# Patient Record
Sex: Female | Born: 1985 | Race: White | Hispanic: No | Smoking: Never smoker
Health system: Southern US, Community
[De-identification: ages and names within clinical notes are randomized; demographics above are authoritative.]

## PROBLEM LIST (undated history)

## (undated) DIAGNOSIS — K589 Irritable bowel syndrome without diarrhea: Secondary | ICD-10-CM

## (undated) DIAGNOSIS — F32A Depression, unspecified: Secondary | ICD-10-CM

## (undated) DIAGNOSIS — F419 Anxiety disorder, unspecified: Secondary | ICD-10-CM

## (undated) DIAGNOSIS — F5101 Primary insomnia: Secondary | ICD-10-CM

## (undated) DIAGNOSIS — D649 Anemia, unspecified: Secondary | ICD-10-CM

## (undated) DIAGNOSIS — R5382 Chronic fatigue, unspecified: Secondary | ICD-10-CM

## (undated) DIAGNOSIS — J069 Acute upper respiratory infection, unspecified: Secondary | ICD-10-CM

## (undated) DIAGNOSIS — F329 Major depressive disorder, single episode, unspecified: Secondary | ICD-10-CM

## (undated) HISTORY — DX: Acute upper respiratory infection, unspecified: J06.9

## (undated) HISTORY — DX: Major depressive disorder, single episode, unspecified: F32.9

## (undated) HISTORY — DX: Depression, unspecified: F32.A

## (undated) HISTORY — DX: Irritable bowel syndrome, unspecified: K58.9

## (undated) HISTORY — DX: Anemia, unspecified: D64.9

## (undated) HISTORY — DX: Anxiety disorder, unspecified: F41.9

## (undated) HISTORY — DX: Primary insomnia: F51.01

## (undated) HISTORY — PX: TUBAL LIGATION: SHX77

## (undated) HISTORY — DX: Chronic fatigue, unspecified: R53.82

---

## 2016-02-10 ENCOUNTER — Ambulatory Visit (HOSPITAL_COMMUNITY)
Admission: EM | Admit: 2016-02-10 | Discharge: 2016-02-10 | Disposition: A | Discharge status: Home / Self Care | Payer: Medicaid Other | Attending: Family Medicine | Admitting: Family Medicine

## 2016-02-10 ENCOUNTER — Encounter (HOSPITAL_COMMUNITY): Payer: Self-pay | Admitting: Emergency Medicine

## 2016-02-10 DIAGNOSIS — L237 Allergic contact dermatitis due to plants, except food: Secondary | ICD-10-CM

## 2016-02-10 MED ORDER — METHYLPREDNISOLONE SODIUM SUCC 125 MG IJ SOLR
INTRAMUSCULAR | Status: AC
  Filled 2016-02-10: qty 2

## 2016-02-10 MED ORDER — PREDNISONE 10 MG (21) PO TBPK: 10.0000 mg | ORAL_TABLET | Freq: Every day | ORAL | 0 refills | Status: DC

## 2016-02-10 MED ORDER — METHYLPREDNISOLONE SODIUM SUCC 125 MG IJ SOLR
125.0000 mg | Freq: Once | INTRAMUSCULAR | Status: AC
  Administered 2016-02-10: 125 mg via INTRAVENOUS

## 2016-02-10 NOTE — ED Triage Notes (Signed)
Pt here for possible poison ivy rash onset 2 days all over body and now in the genital area  States she was working in the yard   Denies fevers, chills  Taking antibiotic for UTI

## 2016-02-10 NOTE — ED Provider Notes (Signed)
CSN: [id number]     Arrival date & time 02/10/16  1443 History   First MD Initiated Contact with Patient 02/10/16 [phone]     Chief Complaint  Patient presents with  . Poison Ivy   (Consider location/radiation/quality/duration/timing/severity/associated sxs/prior Treatment) 30 y.o. female presents with rash to right arm and genital  X 2 days. Patient states she gardening in her yard without wearing gloves. Patient has red streaks with blisters and reports itching Condition is acute in nature. Condition is made better by nothing. Condition is made worse by nothing. Patient denies any relief from benadryl prior to there arrival at this facility.        History reviewed. No pertinent past medical history. History reviewed. No pertinent surgical history. History reviewed. No pertinent family history. Social History  Substance Use Topics  . Smoking status: Never Smoker  . Smokeless tobacco: Never Used  . Alcohol use No   OB History    No data available     Review of Systems  Allergies  Review of patient's allergies indicates no known allergies.  Home Medications   Prior to Admission medications   Not on File   Meds Ordered and Administered this Visit   Medications  methylPREDNISolone sodium succinate (SOLU-MEDROL) 125 mg/2 mL injection 125 mg (not administered)    BP 118/66 (BP Location: Right Arm)   Pulse 71   Temp 97 F (36.1 C) (Oral)   Resp 20   LMP 01/12/2016   SpO2 100%  No data found.   Physical Exam  Urgent Care Course   Clinical Course    Procedures (including critical care time)  Labs Review Labs Reviewed - No data to display  Imaging Review No results found.   Visual Acuity Review  Right Eye Distance:   Left Eye Distance:   Bilateral Distance:    Right Eye Near:   Left Eye Near:    Bilateral Near:         MDM   1. Poison ivy dermatitis        Alene MiresJennifer C Lidiya Reise, NP 02/10/16 [phone]

## 2016-02-10 NOTE — Discharge Instructions (Signed)
Take benadryl as instructed on the back of the box. Can take pepcid for histamine inhibitor .

## 2017-08-28 ENCOUNTER — Other Ambulatory Visit: Payer: Self-pay | Admitting: Orthopedic Surgery

## 2017-08-28 ENCOUNTER — Other Ambulatory Visit (HOSPITAL_COMMUNITY): Payer: Self-pay | Admitting: Orthopedic Surgery

## 2017-08-28 DIAGNOSIS — M25571 Pain in right ankle and joints of right foot: Principal | ICD-10-CM

## 2017-08-28 DIAGNOSIS — G8929 Other chronic pain: Secondary | ICD-10-CM

## 2017-09-01 ENCOUNTER — Other Ambulatory Visit: Payer: Self-pay | Admitting: Orthopedic Surgery

## 2017-09-01 DIAGNOSIS — M25511 Pain in right shoulder: Secondary | ICD-10-CM

## 2017-09-08 ENCOUNTER — Other Ambulatory Visit: Payer: Self-pay

## 2017-09-08 ENCOUNTER — Ambulatory Visit: Payer: Self-pay

## 2017-09-10 ENCOUNTER — Ambulatory Visit
Admission: RE | Admit: 2017-09-10 | Discharge: 2017-09-10 | Disposition: A | Discharge status: Home / Self Care | Payer: Medicaid Other | Source: Ambulatory Visit | Attending: Orthopedic Surgery | Admitting: Orthopedic Surgery

## 2017-09-10 DIAGNOSIS — M7591 Shoulder lesion, unspecified, right shoulder: Secondary | ICD-10-CM | POA: Diagnosis not present

## 2017-09-10 DIAGNOSIS — M25511 Pain in right shoulder: Secondary | ICD-10-CM

## 2017-09-10 DIAGNOSIS — M25811 Other specified joint disorders, right shoulder: Secondary | ICD-10-CM | POA: Diagnosis not present

## 2017-09-10 MED ORDER — GADOBENATE DIMEGLUMINE 529 MG/ML IV SOLN
5.0000 mL | Freq: Once | INTRAVENOUS | Status: AC | PRN
  Administered 2017-09-10: 0.1 mL via INTRA_ARTICULAR

## 2017-09-10 MED ORDER — LIDOCAINE HCL (PF) 1 % IJ SOLN
5.0000 mL | Freq: Once | INTRAMUSCULAR | Status: AC
  Administered 2017-09-10: 5 mL
  Filled 2017-09-10: qty 5

## 2017-09-10 MED ORDER — SODIUM CHLORIDE 0.9 % IJ SOLN
10.0000 mL | INTRAMUSCULAR | Status: DC | PRN
  Administered 2017-09-10: 10 mL
  Filled 2017-09-10: qty 10

## 2017-09-10 MED ORDER — IOPAMIDOL (ISOVUE-200) INJECTION 41%
50.0000 mL | Freq: Once | INTRAVENOUS | Status: AC | PRN
  Administered 2017-09-10: 10 mL
  Filled 2017-09-10: qty 50

## 2017-10-18 ENCOUNTER — Emergency Department (HOSPITAL_COMMUNITY)
Admission: EM | Admit: 2017-10-18 | Discharge: 2017-10-18 | Disposition: A | Discharge status: Home / Self Care | Payer: Medicaid Other | Attending: Emergency Medicine | Admitting: Emergency Medicine

## 2017-10-18 ENCOUNTER — Other Ambulatory Visit: Payer: Self-pay

## 2017-10-18 ENCOUNTER — Encounter (HOSPITAL_COMMUNITY): Payer: Self-pay

## 2017-10-18 DIAGNOSIS — Z79899 Other long term (current) drug therapy: Secondary | ICD-10-CM | POA: Diagnosis not present

## 2017-10-18 DIAGNOSIS — T50901A Poisoning by unspecified drugs, medicaments and biological substances, accidental (unintentional), initial encounter: Secondary | ICD-10-CM | POA: Diagnosis present

## 2017-10-18 LAB — CBC WITH DIFFERENTIAL/PLATELET
ABS IMMATURE GRANULOCYTES: 0 10*3/uL (ref 0.0–0.1)
BASOS ABS: 0 10*3/uL (ref 0.0–0.1)
BASOS PCT: 1 %
EOS ABS: 0.1 10*3/uL (ref 0.0–0.7)
EOS PCT: 2 %
HCT: 41.2 % (ref 36.0–46.0)
Hemoglobin: 13.9 g/dL (ref 12.0–15.0)
Immature Granulocytes: 0 %
Lymphocytes Relative: 42 %
Lymphs Abs: 2.2 10*3/uL (ref 0.7–4.0)
MCH: 30 pg (ref 26.0–34.0)
MCHC: 33.7 g/dL (ref 30.0–36.0)
MCV: 88.8 fL (ref 78.0–100.0)
MONO ABS: 0.7 10*3/uL (ref 0.1–1.0)
Monocytes Relative: 13 %
Neutro Abs: 2.2 10*3/uL (ref 1.7–7.7)
Neutrophils Relative %: 42 %
PLATELETS: 274 10*3/uL (ref 150–400)
RBC: 4.64 MIL/uL (ref 3.87–5.11)
RDW: 12.1 % (ref 11.5–15.5)
WBC: 5.1 10*3/uL (ref 4.0–10.5)

## 2017-10-18 LAB — I-STAT CHEM 8, ED
BUN: 17 mg/dL (ref 6–20)
CALCIUM ION: 1.16 mmol/L (ref 1.15–1.40)
CHLORIDE: 103 mmol/L (ref 101–111)
Creatinine, Ser: 0.9 mg/dL (ref 0.44–1.00)
GLUCOSE: 84 mg/dL (ref 65–99)
HCT: 40 % (ref 36.0–46.0)
Hemoglobin: 13.6 g/dL (ref 12.0–15.0)
Potassium: 3.6 mmol/L (ref 3.5–5.1)
Sodium: 140 mmol/L (ref 135–145)
TCO2: 23 mmol/L (ref 22–32)

## 2017-10-18 LAB — I-STAT BETA HCG BLOOD, ED (MC, WL, AP ONLY)

## 2017-10-18 MED ORDER — SODIUM CHLORIDE 0.9 % IV BOLUS
1000.0000 mL | Freq: Once | INTRAVENOUS | Status: AC
Start: 2017-10-18 — End: 2017-10-18
  Administered 2017-10-18: 1000 mL via INTRAVENOUS

## 2017-10-18 NOTE — ED Provider Notes (Signed)
[facility] EMERGENCY DEPARTMENT Provider Note   CSN: [id number] Arrival date & time: 10/18/17  0118     History   Chief Complaint Chief Complaint  Patient presents with  . Drug Overdose    HPI Ashlee Ellis is a 32 y.o. female.  The history is provided by the patient.  Ingestion  This is a new problem. The current episode started more than 2 days ago. The problem occurs constantly. The problem has not changed since onset.Pertinent negatives include no chest pain, no abdominal pain, no headaches and no shortness of breath. Nothing aggravates the symptoms. Nothing relieves the symptoms. She has tried nothing for the symptoms. The treatment provided no relief.  Accidentally switched her Flagyl which is supposed to be TID and cipro which is supposed to be BID for bacterial overgrowth in the gut.  No muscle weakness.  No tendon issues.  No SI or HI  History reviewed. No pertinent past medical history.  There are no active problems to display for this patient.   History reviewed. No pertinent surgical history.   OB History   None      Home Medications    Prior to Admission medications   Medication Sig Start Date End Date Taking? Authorizing Provider  predniSONE (STERAPRED UNI-PAK 21 TAB) 10 MG (21) TBPK tablet Take 1 tablet (10 mg total) by mouth daily. Take 6 tabs by mouth daily  for 2 days, then 5 tabs for 2 days, then 4 tabs for 2 days, then 3 tabs for 2 days, 2 tabs for 2 days, then 1 tab by mouth daily for 2 days 02/10/16   Alene Mires, NP    Family History No family history on file.  Social History Social History   Tobacco Use  . Smoking status: Never Smoker  . Smokeless tobacco: Never Used  Substance Use Topics  . Alcohol use: No  . Drug use: Not on file     Allergies   Amoxicillin   Review of Systems Review of Systems  Respiratory: Negative for shortness of breath.   Cardiovascular: Negative for chest pain.    Gastrointestinal: Negative for abdominal pain.  Genitourinary: Negative for flank pain.  Musculoskeletal: Negative for arthralgias, back pain, gait problem, joint swelling and neck pain.  Neurological: Negative for headaches.  Psychiatric/Behavioral: Negative for hallucinations, self-injury, sleep disturbance and suicidal ideas.  All other systems reviewed and are negative.    Physical Exam Updated Vital Signs BP 106/70   Pulse 62   Temp 98.4 F (36.9 C) (Oral)   Resp 15   Ht  (1.575 m)   Wt 56.7 kg (125 lb)   SpO2 99%   BMI 22.86 kg/m   Physical Exam  Constitutional: She is oriented to person, place, and time. She appears well-developed and well-nourished. No distress.  HENT:  Head: Normocephalic and atraumatic.  Right Ear: External ear normal.  Left Ear: External ear normal.  Nose: Nose normal.  Mouth/Throat: Oropharynx is clear and moist. No oropharyngeal exudate.  Eyes: Pupils are equal, round, and reactive to light. Conjunctivae and EOM are normal.  Neck: Normal range of motion. Neck supple.  Cardiovascular: Normal rate, regular rhythm, normal heart sounds and intact distal pulses.  Pulmonary/Chest: Effort normal and breath sounds normal. No stridor. She has no wheezes. She has no rales.  Abdominal: Soft. Bowel sounds are normal. She exhibits no mass. There is no tenderness. There is no rebound and no guarding.  Musculoskeletal: Normal range of motion.  Neurological: She is alert and oriented to person, place, and time. She displays normal reflexes.  Skin: Skin is warm and dry. Capillary refill takes less than 2 seconds.  Psychiatric: She has a normal mood and affect. She expresses no homicidal and no suicidal ideation. She expresses no suicidal plans and no homicidal plans.     ED Treatments / Results  Labs (all labs ordered are listed, but only abnormal results are displayed) Results for orders placed or performed during the hospital encounter of 10/18/17   CBC with Differential/Platelet  Result Value Ref Range   WBC 5.1 4.0 - 10.5 K/uL   RBC 4.64 3.87 - 5.11 MIL/uL   Hemoglobin 13.9 12.0 - 15.0 g/dL   HCT 40.9 81.1 - 91.4 %   MCV 88.8 78.0 - 100.0 fL   MCH 30.0 26.0 - 34.0 pg   MCHC 33.7 30.0 - 36.0 g/dL   RDW 78.2 95.6 - 21.3 %   Platelets 274 150 - 400 K/uL   Neutrophils Relative % 42 %   Neutro Abs 2.2 1.7 - 7.7 K/uL   Lymphocytes Relative 42 %   Lymphs Abs 2.2 0.7 - 4.0 K/uL   Monocytes Relative 13 %   Monocytes Absolute 0.7 0.1 - 1.0 K/uL   Eosinophils Relative 2 %   Eosinophils Absolute 0.1 0.0 - 0.7 K/uL   Basophils Relative 1 %   Basophils Absolute 0.0 0.0 - 0.1 K/uL   Immature Granulocytes 0 %   Abs Immature Granulocytes 0.0 0.0 - 0.1 K/uL  I-Stat Chem 8, ED  Result Value Ref Range   Sodium 140 135 - 145 mmol/L   Potassium 3.6 3.5 - 5.1 mmol/L   Chloride 103 101 - 111 mmol/L   BUN 17 6 - 20 mg/dL   Creatinine, Ser 0.86 0.44 - 1.00 mg/dL   Glucose, Bld 84 65 - 99 mg/dL   Calcium, Ion 5.78 4.69 - 1.40 mmol/L   TCO2 23 22 - 32 mmol/L   Hemoglobin 13.6 12.0 - 15.0 g/dL   HCT 62.9 52.8 - 41.3 %  I-Stat Beta hCG blood, ED ([facility], [facility], [facility] only)  Result Value Ref Range   I-stat hCG, quantitative <5.0 <5 mIU/mL   Comment 3           No results found.  EKG EKG Interpretation  Date/Time:  Saturday Oct 18 2017 01:44:04 EDT Ventricular Rate:  76 PR Interval:  120 QRS Duration: 74 QT Interval:  384 QTC Calculation: 432 R Axis:   68 Text Interpretation:  Normal sinus rhythm with sinus arrhythmia Confirmed by Nicanor Alcon, Lundynn Cohoon ([id number]) on 10/18/2017 2:52:58 AM   Radiology No results found.  Procedures Procedures (including critical care time)  Medications Ordered in ED Medications  sodium chloride 0.9 % bolus 1,000 mL (1,000 mLs Intravenous New Bag/Given 10/18/17 0248)     Per poison control check creatinine if normal stable for discharge  Final Clinical Impressions(s) / ED Diagnoses  Counseled on appropriate dose  of medications.  Patient verbalizes understanding.    Return for weakness, numbness, changes in vision or speech, fevers >100.4 unrelieved by medication, shortness of breath, intractable vomiting, or diarrhea, abdominal pain, Inability to tolerate liquids or food, cough, altered mental status or any concerns. No signs of systemic illness or infection. The patient is nontoxic-appearing on exam and vital signs are within normal limits.   I have reviewed the triage vital signs and the nursing notes. Pertinent labs &imaging results that were available during my care of the  patient were reviewed by me and considered in my medical decision making (see chart for details).  After history, exam, and medical workup I feel the patient has been appropriately medically screened and is safe for discharge home. Pertinent diagnoses were discussed with the patient. Patient was given return precautions.         Alica Shellhammer, MD 10/18/17 [phone]

## 2017-10-18 NOTE — ED Triage Notes (Signed)
Pt states that she accidentally switched her two antibiotics Flagyl and Cipro. Flagyl was supposed to be 3 times and she only took twice a day, and Cipro was supposed to be taken twice daily and taken three times for the past four days, she just noticed today. Denies SI/HI. C/o of itchiness all over, restlessness, metallic taste in mouth, irritability, and R shoulder pain, pt list multiple other complaints.

## 2017-10-18 NOTE — ED Notes (Signed)
Poison Control notified; recommend checking renal function.

## 2017-10-22 ENCOUNTER — Ambulatory Visit: Payer: Medicaid Other | Attending: Internal Medicine | Admitting: Physical Therapy

## 2017-10-22 ENCOUNTER — Encounter: Payer: Self-pay | Admitting: Physical Therapy

## 2017-10-22 ENCOUNTER — Other Ambulatory Visit: Payer: Self-pay

## 2017-10-22 DIAGNOSIS — R252 Cramp and spasm: Secondary | ICD-10-CM | POA: Diagnosis present

## 2017-10-22 DIAGNOSIS — R278 Other lack of coordination: Secondary | ICD-10-CM

## 2017-10-22 DIAGNOSIS — M6281 Muscle weakness (generalized): Secondary | ICD-10-CM

## 2017-10-22 NOTE — Therapy (Addendum)
[facility] [address], [place], [place], [address] Phone: [phone]   Fax:  [phone]  Physical Therapy Evaluation  Patient Details  Name: Ashlee Ellis MRN: [id number] Date of Birth: 1986-03-06 Referring Provider: Auburn Bilberry, MD   Encounter Date: 10/22/2017  PT End of Session - 10/22/17 1233    Visit Number  1    Date for PT Re-Evaluation  12/31/17    PT Start Time  7412    PT Stop Time  1221    PT Time Calculation (min)  36 min    Activity Tolerance  Patient tolerated treatment well    Behavior During Therapy  [facility] for tasks assessed/performed       History reviewed. No pertinent past medical history.  History reviewed. No pertinent surgical history.  There were no vitals filed for this visit.   Subjective Assessment - 10/22/17 1147    Subjective  Has weakening and rectocele.  Has been constipated since was young.  Had c-section in 2006 and then pain became severe.  Had severe constipation with second pregnancy that became worse.  Pt has to strain to have BM.  I have pain when constipatied and full sharp pain.      Patient Stated Goals  be able to have BM more easily and avoid surgery    Currently in Pain?  No/denies         [facility] PT Assessment - 10/22/17 0001      Assessment   Medical Diagnosis  M62.89 (ICD-10-CM) - Pelvic floor dysfunction    Referring Provider  Auburn Bilberry, MD    Onset Date/Surgical Date  -- 2014    Prior Therapy  No      Precautions   Precautions  None      Restrictions   Weight Bearing Restrictions  No      Balance Screen   Has the patient fallen in the past 6 months  Yes    How many times?  1 tripped on a [place] residence    Living Arrangements  Spouse/significant other;Children 5      Prior Function   Level of Independence  Independent      Cognition   Overall Cognitive Status  Within Functional  Limits for tasks assessed      ROM / Strength   AROM / PROM / Strength  Strength;PROM      PROM   Overall PROM Comments  right hip ER, IR, flexion 15% limited compared to left hip      Strength   Overall Strength Comments  bilateral hip addcution 4/5; bilateral hip abduciton and flexion 4+/5      Palpation   Palpation comment  hamstrings tight bilaterally Rt>Lt      Special Tests   Other special tests  -- hip tests all negative, lumbar assessed WNL      Ambulation/Gait   Gait Pattern  Within Functional Limits                Objective measurements completed on examination: See above findings.    Pelvic Floor Special Questions - 10/22/17 0001    Prior Pelvic/Prostate Exam  Yes    Date of Last Pelvic/Prostate Exam  -- 1 month ago    Diagnostic Test/Procedure Performed  -- weakness     Are you Pregnant or attempting pregnancy?  No    Currently Sexually Active  Yes    Is this Painful  No    Urinary Leakage  Yes    Activities that cause leaking  Sneezing jumping    Urinary urgency  No    Urinary frequency  nocturia 2-3    Fluid intake  3 liters    Caffeine beverages  No    Falling out feeling (prolapse)  No    Skin Integrity  Intact    Pelvic Floor Internal Exam  pt informed and consent given to perform internal soft tissue assessment    Exam Type  Rectal    Sensation  normal    Palpation  tight and tender anal sphincters and puborectalis, unable to palpate to coccyx due to muscle spasms, unable to relax puborectalis after contracting, unable to coordinate for pushing finger out of rectum    Strength  good squeeze, good lift, able to hold agaisnt strong resistance    Strength # of reps  2 unable to relax after 2 contractions    Strength # of seconds  5    Tone  high               PT Education - 10/22/17 1232    Education provided  Yes    Education Details  POC, toileting    Person(s) Educated  Patient    Methods  Explanation;Handout    Comprehension   Verbalized understanding       PT Short Term Goals - 10/22/17 1418      PT SHORT TERM GOAL #1   Title  pt will be ind with initial HEP and toilet techniques    Time  4    Period  Weeks    Status  New    Target Date  11/19/17        PT Long Term Goals - 10/22/17 1419      PT LONG TERM GOAL #1   Title  pt will be able to have BM of about 4x the volume of current bowel movement due to improved muscle coordination    Baseline  one golf ball size at a time    Time  10    Period  Weeks    Status  New    Target Date  12/31/17      PT LONG TERM GOAL #2   Title  pt will be able to fully relax and bulge anal sphincter muscle    Baseline  unable to lengthen muscles    Time  10    Period  Weeks    Status  New    Target Date  12/31/17      PT LONG TERM GOAL #3   Title  pt will be able to engage transverse abdominis muscle while relaxing pelvic floor in order to have bowel movement without straining or using valsalva maneuver.    Time  10    Period  Weeks    Status  New    Target Date  12/31/17      PT LONG TERM GOAL #4   Title  Pt will demonstrate bilateral hip abduction and adduction strength of 5/5 MMT for improved pelvic stability with less stress on pelvic floor muscles to compensate    Baseline  4/5 to 4+/5    Time  10    Period  Weeks    Status  New    Target Date  12/31/17             Plan - 10/22/17 1400  Clinical Impression Statement  Pt presents to skilled PT due to chronic constipation and rectocele.  Pt is having difficulty with muscle coordination to have bowel movement without excessive bearing down.  Pt has bilateral LE weakness and decreased hip PROM.  Pt has tight hamstrings bilaterally.  She has tight and tender internal and external anal sphincters and puborectalis muscles.  Pt was unable to push finger out when bearing down.  Pt will benefit from skilled PT to address impairments in order to improve abilty to have BM with reduced risk of increasing  rectocele symptoms.     History and Personal Factors relevant to plan of care:  multiple    Clinical Presentation  Stable    Clinical Presentation due to:  pt has been stable since birth of twins    Clinical Decision Making  Moderate    Rehab Potential  Good    PT Frequency  1x / week    PT Duration  Other (comment) 10 weeks    PT Treatment/Interventions  ADLs/Self Care Home Management;Biofeedback;Cryotherapy;Electrical Stimulation;Moist Heat;Therapeutic activities;Therapeutic exercise;Neuromuscular re-education;Patient/family education;Dry needling;Taping;Passive range of motion;Manual techniques    PT Next Visit Plan  biofeedback, review toilet techniques, breath and bulge, stretches    Consulted and Agree with Plan of Care  Patient       Patient will benefit from skilled therapeutic intervention in order to improve the following deficits and impairments:  Increased muscle spasms, Impaired tone, Decreased coordination, Decreased strength, Increased fascial restricitons, Pain  Visit Diagnosis: Cramp and spasm  Muscle weakness (generalized)  Other lack of coordination     Problem List There are no active problems to display for this patient.   Zannie Cove, PT 10/22/2017, 3:19 PM  [facility] [address] [place], [place], [address] Phone: [phone]   Fax:  [phone]  Name: Ashlee Ellis MRN: [id number] Date of Birth: 05-Oct-1985   PHYSICAL THERAPY DISCHARGE SUMMARY  Visits from Start of Care: 1  Current functional level related to goals / functional outcomes: See above   Remaining deficits: See above   Education / Equipment: HEP  Plan: Patient agrees to discharge.  Patient goals were not met. Patient is being discharged due to not returning since the last visit.  ?????     Only attended evaluation  Zannie Cove, PT 11/21/17 11:43 AM

## 2017-10-22 NOTE — Patient Instructions (Signed)
Toileting Techniques for Bowel Movements (Defecation) Using your belly (abdomen) and pelvic floor muscles to have a bowel movement is usually instinctive.  Sometimes people can have problems with these muscles and have to relearn proper defecation (emptying) techniques.  If you have weakness in your muscles, organs that are falling out, decreased sensation in your pelvis, or ignore your urge to go, you may find yourself straining to have a bowel movement.  You are straining if you are: . holding your breath or taking in a huge gulp of air and holding it  . keeping your lips and jaw tensed and closed tightly . turning red in the face because of excessive pushing or forcing . developing or worsening your  hemorrhoids . getting faint while pushing . not emptying completely and have to defecate many times a day  If you are straining, you are actually making it harder for yourself to have a bowel movement.  Many people find they are pulling up with the pelvic floor muscles and closing off instead of opening the anus. Due to lack pelvic floor relaxation and coordination the abdominal muscles, one has to work harder to push the feces out.  Many people have never been taught how to defecate efficiently and effectively.  Notice what happens to your body when you are having a bowel movement.  While you are sitting on the toilet pay attention to the following areas: . Jaw and mouth position . Angle of your hips   . Whether your feet touch the ground or not . Arm placement  . Spine position . Waist . Belly tension . Anus (opening of the anal canal)  An Evacuation/Defecation Plan   Here are the 4 basic points:  1. Lean forward enough for your elbows to rest on your knees 2. Support your feet on the floor or use a low stool if your feet don't touch the floor  3. Push out your belly as if you have swallowed a beach ball-you should feel a widening of your waist 4. Open and relax your pelvic floor muscles,  rather than tightening around the anus      The following conditions my require modifications to your toileting posture:  . If you have had surgery in the past that limits your back, hip, pelvic, knee or ankle flexibility . Constipation   Your healthcare practitioner may make the following additional suggestions and adjustments:  1) Sit on the toilet  a) Make sure your feet are supported. b) Notice your hip angle and spine position-most people find it effective to lean forward or raise their knees, which can help the muscles around the anus to relax  c) When you lean forward, place your forearms on your thighs for support  2) Relax suggestions a) Breath deeply in through your nose and out slowly through your mouth as if you are smelling the flowers and blowing out the candles. b) To become aware of how to relax your muscles, contracting and releasing muscles can be helpful.  Pull your pelvic floor muscles in tightly by using the image of holding back gas, or closing around the anus (visualize making a circle smaller) and lifting the anus up and in.  Then release the muscles and your anus should drop down and feel open. Repeat 5 times ending with the feeling of relaxation. c) Keep your pelvic floor muscles relaxed; let your belly bulge out. d) The digestive tract starts at the mouth and ends at the anal opening, so be   sure to relax both ends of the tube.  Place your tongue on the roof of your mouth with your teeth separated.  This helps relax your mouth and will help to relax the anus at the same time.  3) Empty (defecation) a) Keep your pelvic floor and sphincter relaxed, then bulge your anal muscles.  Make the anal opening wide.  b) Stick your belly out as if you have swallowed a beach ball. c) Make your belly wall hard using your belly muscles while continuing to breathe. Doing this makes it easier to open your anus. d) Breath out and give a grunt (or try using other sounds such as  ahhhh, shhhhh, ohhhh or grrrrrrr).  4) Finish a) As you finish your bowel movement, pull the pelvic floor muscles up and in.  This will leave your anus in the proper place rather than remaining pushed out and down. If you leave your anus pushed out and down, it will start to feel as though that is normal and give you incorrect signals about needing to have a bowel movement.   [facility] [address] [place], [place] [address]  

## 2018-07-14 ENCOUNTER — Other Ambulatory Visit: Payer: Self-pay | Admitting: Unknown Physician Specialty

## 2018-07-14 DIAGNOSIS — IMO0001 Reserved for inherently not codable concepts without codable children: Secondary | ICD-10-CM

## 2018-07-14 DIAGNOSIS — H9042 Sensorineural hearing loss, unilateral, left ear, with unrestricted hearing on the contralateral side: Secondary | ICD-10-CM

## 2018-07-14 DIAGNOSIS — R42 Dizziness and giddiness: Secondary | ICD-10-CM

## 2018-07-21 ENCOUNTER — Ambulatory Visit
Admission: RE | Admit: 2018-07-21 | Discharge: 2018-07-21 | Disposition: A | Discharge status: Home / Self Care | Payer: Medicaid Other | Source: Ambulatory Visit | Attending: Unknown Physician Specialty | Admitting: Unknown Physician Specialty

## 2018-07-21 DIAGNOSIS — H9042 Sensorineural hearing loss, unilateral, left ear, with unrestricted hearing on the contralateral side: Secondary | ICD-10-CM | POA: Insufficient documentation

## 2018-07-21 DIAGNOSIS — IMO0001 Reserved for inherently not codable concepts without codable children: Secondary | ICD-10-CM

## 2018-07-21 DIAGNOSIS — R42 Dizziness and giddiness: Secondary | ICD-10-CM | POA: Diagnosis present

## 2018-07-21 MED ORDER — GADOBUTROL 1 MMOL/ML IV SOLN
5.0000 mL | Freq: Once | INTRAVENOUS | Status: AC | PRN
  Administered 2018-07-21: 5 mL via INTRAVENOUS

## 2018-07-22 ENCOUNTER — Encounter: Payer: Self-pay | Admitting: Family Medicine

## 2018-07-23 ENCOUNTER — Ambulatory Visit: Payer: Medicaid Other | Admitting: Family Medicine

## 2018-07-24 ENCOUNTER — Encounter: Payer: Self-pay | Admitting: Family Medicine

## 2018-07-24 ENCOUNTER — Ambulatory Visit: Payer: Medicaid Other

## 2018-07-24 ENCOUNTER — Ambulatory Visit (INDEPENDENT_AMBULATORY_CARE_PROVIDER_SITE_OTHER): Payer: Medicaid Other | Admitting: Family Medicine

## 2018-07-24 VITALS — BP 114/78 | HR 67 | Resp 17 | Ht 62.5 in | Wt 127.8 lb

## 2018-07-24 DIAGNOSIS — R42 Dizziness and giddiness: Secondary | ICD-10-CM

## 2018-07-24 DIAGNOSIS — R519 Headache, unspecified: Secondary | ICD-10-CM

## 2018-07-24 DIAGNOSIS — H918X2 Other specified hearing loss, left ear: Secondary | ICD-10-CM

## 2018-07-24 DIAGNOSIS — Z7689 Persons encountering health services in other specified circumstances: Secondary | ICD-10-CM

## 2018-07-24 DIAGNOSIS — K581 Irritable bowel syndrome with constipation: Secondary | ICD-10-CM | POA: Diagnosis not present

## 2018-07-24 DIAGNOSIS — R51 Headache: Secondary | ICD-10-CM | POA: Diagnosis not present

## 2018-07-24 NOTE — Progress Notes (Signed)
Ashlee Ellis, is a 33 y.o. female  NWG:[id number]  VHQ:[id number]  DOB - 12-Mar-1986  CC:  Chief Complaint  Patient presents with  . Establish Care       HPI: Ashlee Ellis is a 33 y.o. female is here today to establish care only and has no issues to address today. Previously followed by [facility] and decided to switch primary care providers. She was recently referred to several specialist after experiencing episodes of headache, dizziness, flushing, and fatigue. Complains of also of irregular bowel pattern habits. Seen by ENT they confirmed left sided hearing loss could be causing off-balance or dizziness. Referred to Neurology-has not seen as of yet. Schedule for an anoscopy with GI on Monday. Scheduled for an ophthalmology evaluation.  She also suffers from severe major depressive disorder, general anxiety disorder, and PTSD.  She is followed by behavioral health.  She dislikes the titles associated with these diagnoses.  She is not on any medication for her symptoms and has opted to manage through counseling and therapy. She is prescribed Lexapro as she takes it only when symptoms are present.  She is following up with her counselor every 2 weeks sees a separate relationship counselor and [place] with her husband whom she separated from.  Current medications: Current Outpatient Medications:  .  escitalopram (LEXAPRO) 10 MG tablet, Take 5 mg by mouth daily., Disp: , Rfl:  .  linaclotide (LINZESS) 145 MCG CAPS capsule, Take 145 mcg by mouth daily as needed., Disp: , Rfl:  .  Melatonin 10 MG CAPS, Take 1 capsule by mouth at bedtime., Disp: , Rfl:  .  omeprazole (PRILOSEC) 40 MG capsule, Take 1 capsule by mouth daily., Disp: , Rfl:  .  ondansetron (ZOFRAN-ODT) 4 MG disintegrating tablet, Take 4 mg by mouth every 8 (eight) hours as needed for nausea or vomiting., Disp: , Rfl:    Pertinent family medical history: family history includes Alcohol abuse in her father and maternal  grandmother; Aneurysm in her maternal grandmother; Arthritis in her maternal grandmother and mother; Breast cancer in her maternal aunt; Depression in her mother; Diabetes in her paternal grandmother; Hashimoto's thyroiditis in her sister; Heart disease in her maternal grandfather; Hypertension in her maternal grandfather; Learning disabilities in her son; Stroke in her maternal grandmother; Thyroid disease in her paternal grandmother.   Allergies  Allergen Reactions  . Amoxicillin Rash    Social History   Socioeconomic History  . Marital status: Divorced    Spouse name: Not on file  . Number of children: Not on file  . Years of education: Not on file  . Highest education level: Not on file  Occupational History  . Not on file  Social Needs  . Financial resource strain: Not on file  . Food insecurity:    Worry: Not on file    Inability: Not on file  . Transportation needs:    Medical: Not on file    Non-medical: Not on file  Tobacco Use  . Smoking status: Never Smoker  . Smokeless tobacco: Never Used  Substance and Sexual Activity  . Alcohol use: Yes    Alcohol/week: 4.0 - 5.0 standard drinks    Types: 4 - 5 Glasses of wine per week  . Drug use: Not on file  . Sexual activity: Yes    Partners: Male    Birth control/protection: Surgical  Lifestyle  . Physical activity:    Days per week: Not on file    Minutes per session: Not  on file  . Stress: Not on file  Relationships  . Social connections:    Talks on phone: Not on file    Gets together: Not on file    Attends religious service: Not on file    Active member of club or organization: Not on file    Attends meetings of clubs or organizations: Not on file    Relationship status: Not on file  . Intimate partner violence:    Fear of current or ex partner: Not on file    Emotionally abused: Not on file    Physically abused: Not on file    Forced sexual activity: Not on file  Other Topics Concern  . Not on file   Social History Narrative  . Not on file    Review of Systems: Pertinent negatives listed in HPI Objective:   Vitals:   07/24/18 1101  BP: 114/78  Pulse: 67  Resp: 17  SpO2: 99%    BP Readings from Last 3 Encounters:  07/24/18 114/78  10/18/17 111/79  02/10/16 118/66    Filed Weights   07/24/18 1101  Weight: 127 lb 12.8 oz (58 kg)     Physical Exam: General appearance: alert, well developed, well nourished, cooperative and in no distress Head: Normocephalic, without obvious abnormality, atraumatic Respiratory: Respirations even and unlabored, normal respiratory rate Heart: rate and rhythm normal.  Extremities: No gross deformities Skin: Skin color, texture, turgor normal. No rashes seen  Psych: Speech is pressured and constant , anxious, appropriate behavior Neurologic: Mental status: Alert, oriented to person, place, and time, thought content appropriate.  Lab Results (prior encounters)  Lab Results  Component Value Date   WBC 5.1 10/18/2017   HGB 13.6 10/18/2017   HCT 40.0 10/18/2017   MCV 88.8 10/18/2017   PLT 274 10/18/2017   Lab Results  Component Value Date   CREATININE 0.90 10/18/2017   BUN 17 10/18/2017   NA 140 10/18/2017   K 3.6 10/18/2017   CL 103 10/18/2017       Assessment and plan:  1. Encounter to establish care 2. Dizziness Hydrate well. Continue follow-up with neurology and ENT.  3. Other specified hearing loss of left ear, unspecified hearing status on contralateral side -scheduled for ENT follow-up   4. Nonintractable headache, unspecified chronicity pattern, unspecified headache type -NPO for EGD, afterwards ok to manage with ibuprofen or tylenol   5. Irritable bowel syndrome with constipation -EGD planned for Monday 3/2   Follow-up as needed   The patient was given clear instructions to go to ER or return to medical center if symptoms don't improve, worsen or new problems develop. The patient verbalized understanding. The  patient was advised  to call and obtain lab results if they haven't heard anything from out office within 7-10 business days.  Joaquin Courts, FNP Primary Care at [facility] [address], [place] [address] [phone]: [phone]    This note has been created with Dragon speech recognition software and Paediatric nurse. Any transcriptional errors are unintentional.

## 2018-07-24 NOTE — Patient Instructions (Addendum)
Thank you for choosing Primary Care at [facility] to be your medical home!    Ashlee Ellis was seen by Molli Barrows, FNP today.   Ashlee Ellis's primary care provider is Scot Jun, FNP.   For the best care possible, you should try to see Molli Barrows, FNP-C whenever you come to the clinic.   We look forward to seeing you again soon!  If you have any questions about your visit today, please call us at [phone] or feel free to reach your primary care provider via [employer].      Preventive Care 18-39 Years, Female Preventive care refers to lifestyle choices and visits with your health care provider that can promote health and wellness. What does preventive care include?   A yearly physical exam. This is also called an annual well check.  Dental exams once or twice a year.  Routine eye exams. Ask your health care provider how often you should have your eyes checked.  Personal lifestyle choices, including: ? Daily care of your teeth and gums. ? Regular physical activity. ? Eating a healthy diet. ? Avoiding tobacco and drug use. ? Limiting alcohol use. ? Practicing safe sex. ? Taking vitamin and mineral supplements as recommended by your health care provider. What happens during an annual well check? The services and screenings done by your health care provider during your annual well check will depend on your age, overall health, lifestyle risk factors, and family history of disease. Counseling Your health care provider may ask you questions about your:  Alcohol use.  Tobacco use.  Drug use.  Emotional well-being.  Home and relationship well-being.  Sexual activity.  Eating habits.  Work and work Statistician.  Method of birth control.  Menstrual cycle.  Pregnancy history. Screening You may have the following tests or measurements:  Height, weight, and BMI.  Diabetes screening. This is done by checking your blood sugar (glucose) after you  have not eaten for a while (fasting).  Blood pressure.  Lipid and cholesterol levels. These may be checked every 5 years starting at age 14.  Skin check.  Hepatitis C blood test.  Hepatitis B blood test.  Sexually transmitted disease (STD) testing.  BRCA-related cancer screening. This may be done if you have a family history of breast, ovarian, tubal, or peritoneal cancers.  Pelvic exam and Pap test. This may be done every 3 years starting at age 26. Starting at age 58, this may be done every 5 years if you have a Pap test in combination with an HPV test. Discuss your test results, treatment options, and if necessary, the need for more tests with your health care provider. Vaccines Your health care provider may recommend certain vaccines, such as:  Influenza vaccine. This is recommended every year.  Tetanus, diphtheria, and acellular pertussis (Tdap, Td) vaccine. You may need a Td booster every 10 years.  Varicella vaccine. You may need this if you have not been vaccinated.  HPV vaccine. If you are 31 or younger, you may need three doses over 6 months.  Measles, mumps, and rubella (MMR) vaccine. You may need at least one dose of MMR. You may also need a second dose.  Pneumococcal 13-valent conjugate (PCV13) vaccine. You may need this if you have certain conditions and were not previously vaccinated.  Pneumococcal polysaccharide (PPSV23) vaccine. You may need one or two doses if you smoke cigarettes or if you have certain conditions.  Meningococcal vaccine. One dose is recommended if you are  age 63-21 years and a first-year college student living in a residence hall, or if you have one of several medical conditions. You may also need additional booster doses.  Hepatitis A vaccine. You may need this if you have certain conditions or if you travel or work in places where you may be exposed to hepatitis A.  Hepatitis B vaccine. You may need this if you have certain conditions or if  you travel or work in places where you may be exposed to hepatitis B.  Haemophilus influenzae type b (Hib) vaccine. You may need this if you have certain risk factors. Talk to your health care provider about which screenings and vaccines you need and how often you need them. This information is not intended to replace advice given to you by your health care provider. Make sure you discuss any questions you have with your health care provider. Document Released: 07/09/2001 Document Revised: 12/24/2016 Document Reviewed: 03/14/2015 Elsevier Interactive Patient Education  2019 [employer].

## 2018-08-03 ENCOUNTER — Other Ambulatory Visit: Payer: Self-pay | Admitting: Acute Care

## 2018-08-03 DIAGNOSIS — H93A1 Pulsatile tinnitus, right ear: Secondary | ICD-10-CM

## 2018-08-03 DIAGNOSIS — I729 Aneurysm of unspecified site: Secondary | ICD-10-CM

## 2018-08-03 DIAGNOSIS — R51 Headache: Secondary | ICD-10-CM

## 2018-08-03 DIAGNOSIS — R519 Headache, unspecified: Secondary | ICD-10-CM

## 2018-08-11 ENCOUNTER — Ambulatory Visit
Admission: RE | Admit: 2018-08-11 | Discharge: 2018-08-11 | Disposition: A | Discharge status: Home / Self Care | Payer: Medicaid Other | Source: Ambulatory Visit | Attending: Acute Care | Admitting: Acute Care

## 2018-08-11 ENCOUNTER — Other Ambulatory Visit: Payer: Self-pay

## 2018-08-11 DIAGNOSIS — R519 Headache, unspecified: Secondary | ICD-10-CM

## 2018-08-11 DIAGNOSIS — H93A1 Pulsatile tinnitus, right ear: Secondary | ICD-10-CM | POA: Insufficient documentation

## 2018-08-11 DIAGNOSIS — I729 Aneurysm of unspecified site: Secondary | ICD-10-CM | POA: Diagnosis not present

## 2018-08-11 DIAGNOSIS — R51 Headache: Secondary | ICD-10-CM | POA: Diagnosis present

## 2018-08-13 ENCOUNTER — Encounter (HOSPITAL_COMMUNITY): Payer: Self-pay | Admitting: Emergency Medicine

## 2018-08-13 ENCOUNTER — Other Ambulatory Visit: Payer: Self-pay

## 2018-08-13 ENCOUNTER — Emergency Department (HOSPITAL_COMMUNITY)
Admission: EM | Admit: 2018-08-13 | Discharge: 2018-08-13 | Disposition: A | Discharge status: Home / Self Care | Payer: Medicaid Other | Attending: Emergency Medicine | Admitting: Emergency Medicine

## 2018-08-13 DIAGNOSIS — Z79899 Other long term (current) drug therapy: Secondary | ICD-10-CM | POA: Insufficient documentation

## 2018-08-13 DIAGNOSIS — G43009 Migraine without aura, not intractable, without status migrainosus: Secondary | ICD-10-CM | POA: Insufficient documentation

## 2018-08-13 DIAGNOSIS — R42 Dizziness and giddiness: Secondary | ICD-10-CM

## 2018-08-13 DIAGNOSIS — R51 Headache: Secondary | ICD-10-CM | POA: Diagnosis present

## 2018-08-13 LAB — COMPREHENSIVE METABOLIC PANEL
ALK PHOS: 46 U/L (ref 38–126)
ALT: 18 U/L (ref 0–44)
AST: 22 U/L (ref 15–41)
Albumin: 4.8 g/dL (ref 3.5–5.0)
Anion gap: 10 (ref 5–15)
BILIRUBIN TOTAL: 0.7 mg/dL (ref 0.3–1.2)
BUN: 11 mg/dL (ref 6–20)
CALCIUM: 9.1 mg/dL (ref 8.9–10.3)
CO2: 23 mmol/L (ref 22–32)
CREATININE: 0.89 mg/dL (ref 0.44–1.00)
Chloride: 105 mmol/L (ref 98–111)
Glucose, Bld: 109 mg/dL — ABNORMAL HIGH (ref 70–99)
Potassium: 3.5 mmol/L (ref 3.5–5.1)
Sodium: 138 mmol/L (ref 135–145)
TOTAL PROTEIN: 7.3 g/dL (ref 6.5–8.1)

## 2018-08-13 LAB — CBC
HEMATOCRIT: 40.1 % (ref 36.0–46.0)
Hemoglobin: 13.5 g/dL (ref 12.0–15.0)
MCH: 30.1 pg (ref 26.0–34.0)
MCHC: 33.7 g/dL (ref 30.0–36.0)
MCV: 89.5 fL (ref 80.0–100.0)
NRBC: 0 % (ref 0.0–0.2)
Platelets: 274 10*3/uL (ref 150–400)
RBC: 4.48 MIL/uL (ref 3.87–5.11)
RDW: 11.9 % (ref 11.5–15.5)
WBC: 9.9 10*3/uL (ref 4.0–10.5)

## 2018-08-13 LAB — I-STAT BETA HCG BLOOD, ED (MC, WL, AP ONLY): I-stat hCG, quantitative: <5 m[IU]/mL (ref <5)

## 2018-08-13 LAB — LIPASE, BLOOD: Lipase: 30 U/L (ref 11–51)

## 2018-08-13 MED ORDER — METOCLOPRAMIDE HCL 5 MG/ML IJ SOLN
10.0000 mg | Freq: Once | INTRAMUSCULAR | Status: AC
  Administered 2018-08-13: 10 mg via INTRAVENOUS
  Filled 2018-08-13: qty 2

## 2018-08-13 MED ORDER — ONDANSETRON HCL 4 MG/2ML IJ SOLN
4.0000 mg | Freq: Once | INTRAMUSCULAR | Status: AC | PRN
  Administered 2018-08-13: 4 mg via INTRAVENOUS
  Filled 2018-08-13: qty 2

## 2018-08-13 MED ORDER — ONDANSETRON 4 MG PO TBDP: 4.0000 mg | ORAL_TABLET | Freq: Four times a day (QID) | ORAL | 0 refills | Status: DC | PRN

## 2018-08-13 MED ORDER — MECLIZINE HCL 25 MG PO TABS
50.0000 mg | ORAL_TABLET | Freq: Once | ORAL | Status: AC
  Administered 2018-08-13: 50 mg via ORAL
  Filled 2018-08-13: qty 2

## 2018-08-13 MED ORDER — SODIUM CHLORIDE 0.9 % IV BOLUS (SEPSIS)
1000.0000 mL | Freq: Once | INTRAVENOUS | Status: AC
  Administered 2018-08-13: 1000 mL via INTRAVENOUS

## 2018-08-13 MED ORDER — MECLIZINE HCL 25 MG PO TABS: 50.0000 mg | ORAL_TABLET | Freq: Three times a day (TID) | ORAL | 0 refills | Status: DC | PRN

## 2018-08-13 MED ORDER — KETOROLAC TROMETHAMINE 30 MG/ML IJ SOLN
30.0000 mg | Freq: Once | INTRAMUSCULAR | Status: AC
  Administered 2018-08-13: 30 mg via INTRAVENOUS
  Filled 2018-08-13: qty 1

## 2018-08-13 MED ORDER — DIPHENHYDRAMINE HCL 50 MG/ML IJ SOLN
25.0000 mg | Freq: Once | INTRAMUSCULAR | Status: AC
  Administered 2018-08-13: 25 mg via INTRAVENOUS
  Filled 2018-08-13: qty 1

## 2018-08-13 NOTE — Discharge Instructions (Signed)
Please follow-up with your neurologist.  You may alternate Tylenol 1000 mg every 6 hours as needed for pain and Ibuprofen 800 mg every 8 hours as needed for pain.  Please take Ibuprofen with food.

## 2018-08-13 NOTE — ED Notes (Signed)
  Patient tolerated PO fluids and ambulated up and down the hallway with standby assist.

## 2018-08-13 NOTE — ED Triage Notes (Signed)
  Patient comes in with N/V that started a couple hours ago.  Patient states she has had intermittent N/V since December and has been testing for several things with no diagnosis.  Patient states she sees a neurologist for headaches and has had a MRI recently.  Patient states she hurts all over and feels dizzy.  Pain 8/10 in throat and head.  Patient A&O x4.

## 2018-08-13 NOTE — ED Provider Notes (Signed)
TIME SEEN: 3:47 AM  CHIEF COMPLAINT: Headache, vomiting  HPI: Patient is a 33 year old female with history of depression, anxiety, anemia, chronic fatigue who presents to the emergency department with headache and vomiting.  States since December she has had 11 similar episodes.  She has been seen by her PCP, ENT and now neurology at [facility].  States that she has had a normal MRI of her brain and MRA.  States headaches are diffuse, throbbing and today associated with photophobia and phonophobia.  States headache started first then vomiting then ringing in the right ear then diffuse abdominal pain and sometimes diarrhea.  She will often have vertigo.  No fevers.  No head injury.  No numbness or focal weakness.  States she has follow-up with neurology on March 31.  ROS: See HPI Constitutional: no fever  Eyes: no drainage  ENT: no runny nose   Cardiovascular:  no chest pain  Resp: no SOB  GI: no vomiting GU: no dysuria Integumentary: no rash  Allergy: no hives  Musculoskeletal: no leg swelling  Neurological: no slurred speech ROS otherwise negative  PAST MEDICAL HISTORY/PAST SURGICAL HISTORY:  Past Medical History:  Diagnosis Date  . Anemia   . Anxiety   . Chronic fatigue   . Depression   . IBS (irritable bowel syndrome)   . Primary insomnia     MEDICATIONS:  Prior to Admission medications   Medication Sig Start Date End Date Taking? Authorizing Provider  escitalopram (LEXAPRO) 10 MG tablet Take 5 mg by mouth daily.    [provider]  linaclotide (LINZESS) 145 MCG CAPS capsule Take 145 mcg by mouth daily as needed.    [provider]  Melatonin 10 MG CAPS Take 1 capsule by mouth at bedtime.    [provider]  omeprazole (PRILOSEC) 40 MG capsule Take 1 capsule by mouth daily. 07/16/18   [provider]  ondansetron (ZOFRAN-ODT) 4 MG disintegrating tablet Take 4 mg by mouth every 8 (eight) hours as needed for nausea or vomiting.    [provider]    ALLERGIES:  Allergies  Allergen Reactions  . Amoxicillin Rash    SOCIAL HISTORY:  Social History   Tobacco Use  . Smoking status: Never Smoker  . Smokeless tobacco: Never Used  Substance Use Topics  . Alcohol use: Yes    Alcohol/week: 4.0 - 5.0 standard drinks    Types: 4 - 5 Glasses of wine per week    FAMILY HISTORY: Family History  Problem Relation Age of Onset  . Depression Mother   . Arthritis Mother   . Alcohol abuse Father   . Hashimoto's thyroiditis Sister   . Alcohol abuse Maternal Grandmother   . Aneurysm Maternal Grandmother   . Arthritis Maternal Grandmother   . Stroke Maternal Grandmother   . Hypertension Maternal Grandfather   . Heart disease Maternal Grandfather   . Diabetes Paternal Grandmother   . Thyroid disease Paternal Grandmother   . Breast cancer Maternal Aunt   . Learning disabilities Son     EXAM: BP 124/90 (BP Location: Right Arm)   Pulse 88   Temp 97.9 F (36.6 C) (Oral)   Resp 18   Ht 5\' 2"  (1.575 m)   Wt 56.7 kg   SpO2 100%   BMI 22.86 kg/m  CONSTITUTIONAL: Alert and oriented and responds appropriately to questions.  Appears uncomfortable, actively vomiting nonbilious, nonbloody emesis HEAD: Normocephalic EYES: Conjunctivae clear, pupils appear equal, EOMI; + photophobia ENT: normal nose; moist  mucous membranes NECK: Supple, no meningismus, no nuchal rigidity, no LAD  CARD: RRR; S1 and S2 appreciated; no murmurs, no clicks, no rubs, no gallops RESP: Normal chest excursion without splinting or tachypnea; breath sounds clear and equal bilaterally; no wheezes, no rhonchi, no rales, no hypoxia or respiratory distress, speaking full sentences ABD/GI: Normal bowel sounds; non-distended; soft, non-tender, no rebound, no guarding, no peritoneal signs, no hepatosplenomegaly BACK:  The back appears normal and is non-tender to palpation, there is no CVA tenderness EXT: Normal ROM in all joints; non-tender to palpation; no  edema; normal capillary refill; no cyanosis, no calf tenderness or swelling    SKIN: Normal color for age and race; warm; no rash NEURO: Moves all extremities equally, normal sensation diffusely, cranial nerves II through XII intact, normal speech PSYCH: The patient's mood and manner are appropriate. Grooming and personal hygiene are appropriate.  MEDICAL DECISION MAKING: Patient here with what sounds like a migraine headache.  She states headache comes on first and then she develops some symptoms of vertigo, tinnitus, vomiting.  No focal neurologic deficits.  Has had MRI and MRA recently which were unremarkable.  I have reviewed these records.  Complains of abdominal pain but abdominal exam is benign.  Nursing staff has ordered abdominal labs.  Will treat with IV fluids, Toradol, Reglan, Benadryl, meclizine.  Doubt intracranial hemorrhage, stroke, cavernous sinus thrombosis.  I do not feel she needs emergent head imaging.  ED PROGRESS: Patient reports feeling better.  Headache is gone.  Vertigo almost completely resolved.  Nausea is resolved.  Still having ringing in the right ear.  Will p.o. challenge patient and attempt to ambulate in the ED.   Patient able to ambulate without difficulty and had quick and steady gait.  Drinking without difficulty no vomiting.  Reports feeling much better.  She will follow-up with her outpatient neurologist.  Will discharge with meclizine and Zofran to take as needed.  She is on nortriptyline for her headaches.   At this time, I do not feel there is any life-threatening condition present. I have reviewed and discussed all results (EKG, imaging, lab, urine as appropriate) and exam findings with patient/family. I have reviewed nursing notes and appropriate previous records.  I feel the patient is safe to be discharged home without further emergent workup and can continue workup as an outpatient as needed. Discussed usual and customary return precautions. Patient/family  verbalize understanding and are comfortable with this plan.  Outpatient follow-up has been provided as needed. All questions have been answered.     Brinklee Cisse, Layla Maw, DO 08/13/18 [phone]

## 2018-08-17 ENCOUNTER — Telehealth: Payer: Self-pay | Admitting: Family Medicine

## 2018-08-17 ENCOUNTER — Other Ambulatory Visit: Payer: Self-pay | Admitting: Family Medicine

## 2018-08-17 DIAGNOSIS — Z7689 Persons encountering health services in other specified circumstances: Secondary | ICD-10-CM

## 2018-08-17 NOTE — Telephone Encounter (Signed)
erroneous

## 2018-08-17 NOTE — Progress Notes (Signed)
Referral request via phone for abnormal vaginal bleeding

## 2018-08-17 NOTE — Telephone Encounter (Signed)
Patient would like Gyn referral to be sent to [facility] [address] [place] [phone]

## 2018-08-19 ENCOUNTER — Ambulatory Visit: Payer: Medicaid Other | Admitting: Family Medicine

## 2018-11-25 ENCOUNTER — Telehealth: Payer: Self-pay | Admitting: Family Medicine

## 2018-11-25 NOTE — Telephone Encounter (Signed)
Ashlee Ellis,  Please follow-up with the patient to find out exactly what condition she is needing the referral for in order for me to send referral request.

## 2018-11-25 NOTE — Telephone Encounter (Signed)
Patient states that she has tinnitus, high frequency hearing loss in the R ear & complete hearing loss in the L ear. Has seen Dr. Tami Ribas previously but needs a new referral.

## 2018-11-25 NOTE — Telephone Encounter (Signed)
Patient needs a new referral to ENT Dr [facility]

## 2018-11-26 ENCOUNTER — Other Ambulatory Visit: Payer: Self-pay | Admitting: Family Medicine

## 2018-11-26 ENCOUNTER — Encounter: Payer: Self-pay | Admitting: Family Medicine

## 2018-11-26 DIAGNOSIS — H9193 Unspecified hearing loss, bilateral: Secondary | ICD-10-CM

## 2018-11-26 DIAGNOSIS — H918X2 Other specified hearing loss, left ear: Secondary | ICD-10-CM

## 2018-11-26 DIAGNOSIS — H9313 Tinnitus, bilateral: Secondary | ICD-10-CM

## 2018-11-26 NOTE — Telephone Encounter (Signed)
Referral packet faxed to:  [facility] Ph. [phone] Fa. [phone]

## 2018-11-26 NOTE — Progress Notes (Signed)
Ashlee Ellis is requesting a new referral to Dr. Beverly Gust, ENT for continued management of tinnitus, high frequency hearing loss in the R ear & complete hearing loss in the L ear.  Patient is already established but needs an updated referral for insurance purposes. Referral placed in orders only encounter.  Molli Barrows, FNP

## 2018-11-26 NOTE — Progress Notes (Signed)
Ashlee Ellis is requesting a new referral to Dr. Beverly Gust, ENT for continued management of tinnitus, high frequency hearing loss in the R ear & complete hearing loss in the L ear.  Patient is already established but needs an updated referral for insurance purposes. Referral placed in orders only encounter.

## 2018-11-26 NOTE — Telephone Encounter (Addendum)
Completed. Please fax referral along with my documentation note to Dr. Beverly Gust in [place]

## 2018-12-16 ENCOUNTER — Other Ambulatory Visit: Payer: Self-pay | Admitting: Nurse Practitioner

## 2018-12-16 DIAGNOSIS — F4329 Adjustment disorder with other symptoms: Secondary | ICD-10-CM

## 2019-04-24 ENCOUNTER — Encounter: Payer: Self-pay | Admitting: Family Medicine

## 2019-05-06 ENCOUNTER — Ambulatory Visit (INDEPENDENT_AMBULATORY_CARE_PROVIDER_SITE_OTHER): Payer: Medicaid Other | Admitting: Family Medicine

## 2019-05-06 ENCOUNTER — Encounter: Payer: Self-pay | Admitting: Family Medicine

## 2019-05-06 DIAGNOSIS — H903 Sensorineural hearing loss, bilateral: Secondary | ICD-10-CM

## 2019-05-06 DIAGNOSIS — D8989 Other specified disorders involving the immune mechanism, not elsewhere classified: Secondary | ICD-10-CM | POA: Diagnosis not present

## 2019-05-06 NOTE — Progress Notes (Signed)
Would like a referral to a different Rheumatologist for a second opinion.

## 2019-05-06 NOTE — Progress Notes (Signed)
Virtual Visit via Telephone Note  I connected with Nadyne Coombes, on 05/06/2019 at 8:43 AM by telephone due to the COVID-19 pandemic and verified that I am speaking with the correct person using two identifiers.   Consent: I discussed the limitations, risks, security and privacy concerns of performing an evaluation and management service by telephone and the availability of in person appointments. I also discussed with the patient that there may be a patient responsible charge related to this service. The patient expressed understanding and agreed to proceed.   Location of Patient: Home  Location of Provider: Clinic   Persons participating in Telemedicine visit: Markesia Crilly Henlee Donovan-CMA Leanora Ivanoff - spouse Dr. Margarita Rana     History of Present Illness: Ashlee Ellis is a 33 year old female here for follow up of hearing loss which has been present for the last 11-12 months. Seen by [facility] and notes revealed profound left sensorineural hearing loss, mild right sensorineural hearing loss.  She was referred to [facility] by her ENT due to a concern for autoimmune etiology of hearing loss with last visit on 04/14/19 where she was prescribed Methotrexate and Prednisone.  Labs from care everywhere revealed positive ANA, positive ANCA  She still has fluctuating hearing loss; today is another bad hearing day, associated muffled sound for her with associated tinnitus and she is getting frustrated with all of this but she endorses better hearing days on her medications. She currently uses hearing aids. She has never been on medications in the past and does not feel comfortable with her medications due to unknown long term side effects. She uses meclizine as needed; her last nausea episode was on 04/20/19. She denies presence of dizziness, headaches, visual problems and is requesting a second opinion.  Husband is present through out encounter and assist with repeating questions to the  patient.  Past Medical History:  Diagnosis Date  . Anemia   . Anxiety   . Chronic fatigue   . Depression   . IBS (irritable bowel syndrome)   . Primary insomnia    Allergies  Allergen Reactions  . Amoxicillin Rash    Current Outpatient Medications on File Prior to Visit  Medication Sig Dispense Refill  . folic acid (FOLVITE) 1 MG tablet Take 1 tablet by mouth daily.    . methotrexate (RHEUMATREX) 2.5 MG tablet Take 8 tablets by mouth once a week.    . predniSONE (DELTASONE) 10 MG tablet Take 60mg  daily (6 pills) for 1 week, then 50mg  (5 pills), then 40mg  daily (4 pills) for 1 week, then 30mg  for 1 week, then 20mg  for 1 week, then 15mg  for 1 week, then 10mg  for 1 week, then 5mg  for 1 week.    . meclizine (ANTIVERT) 25 MG tablet Take 2 tablets (50 mg total) by mouth 3 (three) times daily as needed for dizziness. 30 tablet 0  . mirtazapine (REMERON) 30 MG tablet Take 1 tablet by mouth at bedtime as needed.    . ondansetron (ZOFRAN ODT) 4 MG disintegrating tablet Take 1 tablet (4 mg total) by mouth every 6 (six) hours as needed. 20 tablet 0   No current facility-administered medications on file prior to visit.    Observations/Objective: Awake, alert, oriented x3 Not in acute distress  Assessment and Plan: 1. Sensorineural hearing loss (SNHL) of both ears Slightly improved on current regimen with intermittent flares Continue Methotrexate and Prednisone as per Rheumatology Referral for second opinion as requested - Ambulatory referral to Rheumatology  2. Autoimmune disorder (HCC) See #1 above - Ambulatory referral to Rheumatology   Follow Up Instructions: Keep previously scheduled appointment.   I discussed the assessment and treatment plan with the patient. The patient was provided an opportunity to ask questions and all were answered. The patient agreed with the plan and demonstrated an understanding of the instructions.   The patient was advised to call back or seek an  in-person evaluation if the symptoms worsen or if the condition fails to improve as anticipated.     I provided 20 minutes total of non-face-to-face time during this encounter including median intraservice time, reviewing previous notes, labs, imaging, medications, management and patient verbalized understanding.     Hoy Register, MD, FAAFP. [facility] and Wellness [place], [place] [phone]   05/06/2019, 8:43 AM

## 2019-08-05 ENCOUNTER — Ambulatory Visit
Admission: EM | Admit: 2019-08-05 | Discharge: 2019-08-05 | Disposition: A | Discharge status: Home / Self Care | Payer: Medicaid Other | Attending: Internal Medicine | Admitting: Internal Medicine

## 2019-08-05 ENCOUNTER — Ambulatory Visit (INDEPENDENT_AMBULATORY_CARE_PROVIDER_SITE_OTHER): Payer: Medicaid Other

## 2019-08-05 ENCOUNTER — Other Ambulatory Visit: Payer: Self-pay

## 2019-08-05 ENCOUNTER — Encounter: Payer: Self-pay | Admitting: Emergency Medicine

## 2019-08-05 DIAGNOSIS — R0602 Shortness of breath: Secondary | ICD-10-CM

## 2019-08-05 DIAGNOSIS — Z20822 Contact with and (suspected) exposure to covid-19: Secondary | ICD-10-CM

## 2019-08-05 MED ORDER — SPACER/AERO-HOLDING CHAMBERS DEVI: 1.0000 | Freq: Once | 0 refills | Status: AC

## 2019-08-05 MED ORDER — FLUTICASONE PROPIONATE 50 MCG/ACT NA SUSP: 1.0000 | Freq: Every day | NASAL | 0 refills | Status: DC

## 2019-08-05 MED ORDER — ALBUTEROL SULFATE HFA 108 (90 BASE) MCG/ACT IN AERS: 1.0000 | INHALATION_SPRAY | Freq: Four times a day (QID) | RESPIRATORY_TRACT | 0 refills | Status: DC | PRN

## 2019-08-05 NOTE — ED Triage Notes (Addendum)
Pt presents to [facility] for assessment of shortness of breath over the last 3 days, worse with exertion.   States the extra effort to breath is making her fatigued.  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  She spoke to her rheumatologist who recommended she come for assessment (and possible chest xray).  Rheumatologist also states it is possible side effect of methotrexate, and to stop taking.   Last dose Tuesday.  Pt speaking in full sentences in room, NAD, denies chest pain.

## 2019-08-05 NOTE — Discharge Instructions (Signed)
Xray normal Try taking daily cetirizine/zyrtec or claritin over the counter Flonase nasal spray 1-2 spray daily in each nostril Albuterol as needed for shortness of breath, wheezing, 1-2 puffs every 4-6 hours  Follow up if not improving or worsening

## 2019-08-05 NOTE — ED Provider Notes (Signed)
[facility]    CSN: [id number] Arrival date & time: 08/05/19  1502      History   Chief Complaint Chief Complaint  Patient presents with  . Shortness of Breath    HPI Ashlee Ellis is a 34 y.o. female history of tubal ligation, presenting today for evaluation of shortness of breath.  Patient states that over the past 4 days she has had persistent shortness of breath along with a slight pressure sensation over her chest.  Patient denies any significant associated cough, sore throat or any URI symptoms of congestion or rhinorrhea.  She has felt as if her nose is stopped up slightly and has had slight eye itching.  She denies any Covid exposure.  Denies fevers chills or body aches.  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  She was recently started on methotrexate at the end of November, rheumatologist felt the shortness of breath possibly might be a side effect of methotrexate and recommended for her to stop, but also to be further evaluated for other potential causes.  She denies any prior DVT/PE.  Denies leg pain or leg swelling.  Denies any recent travel immobilization or surgeries.  Denies tobacco use.  Denies estrogen use.  Denies associated chest pain with shortness of breath.  HPI  Past Medical History:  Diagnosis Date  . Anemia   . Anxiety   . Chronic fatigue   . Depression   . IBS (irritable bowel syndrome)   . Primary insomnia     There are no problems to display for this patient.   Past Surgical History:  Procedure Laterality Date  . CESAREAN SECTION    . TUBAL LIGATION      OB History   No obstetric history on file.      Home Medications    Prior to Admission medications   Medication Sig Start Date End Date Taking? Authorizing Provider  rizatriptan (MAXALT) 10 MG tablet Take 10 mg by mouth as needed for migraine. May repeat in 2 hours if needed   Yes [provider]    albuterol (VENTOLIN HFA) 108 (90 Base) MCG/ACT inhaler Inhale 1-2 puffs into the lungs every 6 (six) hours as needed for wheezing or shortness of breath. 08/05/19   Novaleigh Kohlman C, PA-C  fluticasone (FLONASE) 50 MCG/ACT nasal spray Place 1-2 sprays into both nostrils daily. 08/05/19   Levan Aloia C, PA-C  folic acid (FOLVITE) 1 MG tablet Take 1 tablet by mouth daily. 04/12/19 04/11/20  [provider]  meclizine (ANTIVERT) 25 MG tablet Take 2 tablets (50 mg total) by mouth 3 (three) times daily as needed for dizziness. 08/13/18   Ward, Layla Maw, DO  methotrexate (RHEUMATREX) 2.5 MG tablet Take 8 tablets by mouth once a week. 04/12/19   [provider]  mirtazapine (REMERON) 30 MG tablet Take 1 tablet by mouth at bedtime as needed.    [provider]  ondansetron (ZOFRAN ODT) 4 MG disintegrating tablet Take 1 tablet (4 mg total) by mouth every 6 (six) hours as needed. 08/13/18   Ward, Layla Maw, DO  predniSONE (DELTASONE) 10 MG tablet Take 60mg  daily (6 pills) for 1 week, then 50mg  (5 pills), then 40mg  daily (4 pills) for 1 week, then 30mg  for 1 week, then 20mg  for 1 week, then 15mg  for 1 week, then 10mg  for 1 week, then 5mg  for 1 week. 04/12/19   [provider]  Spacer/Aero-Holding  Chambers DEVI 1 each by Does not apply route once for 1 dose. 08/05/19 08/05/19  Eather Chaires, Elesa Hacker, PA-C    Family History Family History  Problem Relation Age of Onset  . Depression Mother   . Arthritis Mother   . Alcohol abuse Father   . Hashimoto's thyroiditis Sister   . Alcohol abuse Maternal Grandmother   . Aneurysm Maternal Grandmother   . Arthritis Maternal Grandmother   . Stroke Maternal Grandmother   . Hypertension Maternal Grandfather   . Heart disease Maternal Grandfather   . Diabetes Paternal Grandmother   . Thyroid disease Paternal Grandmother   . Breast cancer Maternal Aunt   . Learning disabilities Son     Social History Social History   Tobacco Use   . Smoking status: Never Smoker  . Smokeless tobacco: Never Used  Substance Use Topics  . Alcohol use: Yes    Alcohol/week: 4.0 - 5.0 standard drinks    Types: 4 - 5 Glasses of wine per week  . Drug use: Not on file     Allergies   Amoxicillin   Review of Systems Review of Systems  Constitutional: Negative for activity change, appetite change, chills, fatigue and fever.  HENT: Negative for congestion, ear pain, rhinorrhea, sinus pressure, sore throat and trouble swallowing.   Eyes: Negative for discharge and redness.  Respiratory: Positive for shortness of breath. Negative for cough and chest tightness.   Cardiovascular: Negative for chest pain.  Gastrointestinal: Negative for abdominal pain, diarrhea, nausea and vomiting.  Musculoskeletal: Negative for myalgias.  Skin: Negative for rash.  Neurological: Negative for dizziness, light-headedness and headaches.     Physical Exam Triage Vital Signs ED Triage Vitals  Enc Vitals Group     BP 08/05/19 1511 118/80     Pulse Rate 08/05/19 1511 71     Resp 08/05/19 1511 16     Temp 08/05/19 1511 97.7 F (36.5 C)     Temp Source 08/05/19 1511 Oral     SpO2 08/05/19 1511 98 %     Weight --      Height --      Head Circumference --      Peak Flow --      Pain Score 08/05/19 1515 0     Pain Loc --      Pain Edu? --      Excl. in [place]? --    No data found.  Updated Vital Signs BP 118/80 (BP Location: Left Arm)   Pulse 71   Temp 97.7 F (36.5 C) (Oral)   Resp 16   SpO2 98%   Visual Acuity Right Eye Distance:   Left Eye Distance:   Bilateral Distance:    Right Eye Near:   Left Eye Near:    Bilateral Near:     Physical Exam Vitals and nursing note reviewed.  Constitutional:      General: She is not in acute distress.    Appearance: She is well-developed.  HENT:     Head: Normocephalic and atraumatic.     Ears:     Comments: Bilateral ears without tenderness to palpation of external auricle, tragus and mastoid,  EAC's without erythema or swelling, TM's with good bony landmarks and cone of light. Non erythematous.     Mouth/Throat:     Comments: Oral mucosa pink and moist, no tonsillar enlargement or exudate. Posterior pharynx patent and nonerythematous, no uvula deviation or swelling. Normal phonation. Eyes:     Conjunctiva/sclera: Conjunctivae  normal.  Cardiovascular:     Rate and Rhythm: Normal rate and regular rhythm.     Heart sounds: No murmur.  Pulmonary:     Effort: Pulmonary effort is normal. No respiratory distress.     Breath sounds: Normal breath sounds.     Comments: Breathing comfortably at rest, CTABL, no wheezing, rales or other adventitious sounds auscultated Abdominal:     Palpations: Abdomen is soft.     Tenderness: There is no abdominal tenderness.  Musculoskeletal:     Cervical back: Neck supple.     Comments: Bilateral lower extremities without swelling or erythema, no calf tenderness to palpation  Skin:    General: Skin is warm and dry.  Neurological:     Mental Status: She is alert.      UC Treatments / Results  Labs (all labs ordered are listed, but only abnormal results are displayed) Labs Reviewed  NOVEL CORONAVIRUS, NAA    EKG   Radiology DG Chest 2 View  Result Date: 08/05/2019 CLINICAL DATA:  Shortness of breath for 4 days. EXAM: CHEST - 2 VIEW COMPARISON:  None. FINDINGS: The heart size and mediastinal contours are within normal limits. Both lungs are clear. The visualized skeletal structures are unremarkable. IMPRESSION: No active cardiopulmonary disease. Electronically Signed   By: Sherian Rein M.D.   On: 08/05/2019 15:52    Procedures Procedures (including critical care time)  Medications Ordered in UC Medications - No data to display  Initial Impression / Assessment and Plan / UC Course  I have reviewed the triage vital signs and the nursing notes.  Pertinent labs & imaging results that were available during my care of the patient were  reviewed by me and considered in my medical decision making (see chart for details).     Chest x-ray without acute abnormality or sign of pneumonia.  Not suggestive of cause of shortness of breath.  Covid PCR pending.  Will treat symptomatically and supportively at this time, recommend initiating daily allergy medicines for this antihistamine and Flonase.  May try albuterol although no significant wheezing.  Continue to monitor and follow-up with rheumatology/PCP as planned.  PERC negative.  No tachycardia or hypoxia.  Discussed strict return precautions. Patient verbalized understanding and is agreeable with plan.  Final Clinical Impressions(s) / UC Diagnoses   Final diagnoses:  Shortness of breath     Discharge Instructions     Xray normal Try taking daily cetirizine/zyrtec or claritin over the counter Flonase nasal spray 1-2 spray daily in each nostril Albuterol as needed for shortness of breath, wheezing, 1-2 puffs every 4-6 hours  Follow up if not improving or worsening   ED Prescriptions    Medication Sig Dispense Auth. Provider   albuterol (VENTOLIN HFA) 108 (90 Base) MCG/ACT inhaler Inhale 1-2 puffs into the lungs every 6 (six) hours as needed for wheezing or shortness of breath. 8 g [place] Zucco, [place] C, PA-C   Spacer/Aero-Holding Chambers DEVI 1 each by Does not apply route once for 1 dose. 1 each Kennia Vanvorst C, PA-C   fluticasone (FLONASE) 50 MCG/ACT nasal spray Place 1-2 sprays into both nostrils daily. 15.8 mL Jameia Makris, [place] C, PA-C     PDMP not reviewed this encounter.   Lew Dawes, [place] 08/05/19 1658

## 2019-08-05 NOTE — ED Notes (Signed)
Patient able to ambulate independently  

## 2019-08-06 LAB — NOVEL CORONAVIRUS, NAA: SARS-CoV-2, NAA: NOT DETECTED

## 2019-08-17 ENCOUNTER — Encounter: Payer: Self-pay | Admitting: Family Medicine

## 2019-08-30 ENCOUNTER — Telehealth (INDEPENDENT_AMBULATORY_CARE_PROVIDER_SITE_OTHER): Payer: Medicaid Other | Admitting: Internal Medicine

## 2019-08-30 ENCOUNTER — Encounter: Payer: Self-pay | Admitting: Internal Medicine

## 2019-08-30 ENCOUNTER — Ambulatory Visit: Payer: Medicaid Other | Attending: Internal Medicine

## 2019-08-30 DIAGNOSIS — D8989 Other specified disorders involving the immune mechanism, not elsewhere classified: Secondary | ICD-10-CM

## 2019-08-30 DIAGNOSIS — G629 Polyneuropathy, unspecified: Secondary | ICD-10-CM | POA: Diagnosis not present

## 2019-08-30 DIAGNOSIS — R0602 Shortness of breath: Secondary | ICD-10-CM | POA: Diagnosis not present

## 2019-08-30 DIAGNOSIS — M2550 Pain in unspecified joint: Secondary | ICD-10-CM

## 2019-08-30 DIAGNOSIS — Z20822 Contact with and (suspected) exposure to covid-19: Secondary | ICD-10-CM

## 2019-08-30 NOTE — Progress Notes (Signed)
Virtual Visit via Telephone Note  I connected with Ashlee Ellis, on 08/30/2019 at 1:33 PM by telephone due to the COVID-19 pandemic and verified that I am speaking with the correct person using two identifiers.   Consent: I discussed the limitations, risks, security and privacy concerns of performing an evaluation and management service by telephone and the availability of in person appointments. I also discussed with the patient that there may be a patient responsible charge related to this service. The patient expressed understanding and agreed to proceed.   Location of Patient: Home   Location of Provider: Clinic    Persons participating in Telemedicine visit: Rex Magee [facility] Dr. Earlene Plater      History of Present Illness: Patient has f/u after seeing [facility] rheumatology for second opinion. Please see below for synopsis of her medical history. Today, patient reports she has developed some new onset symptoms of numbness and tingling in her arms from her hands up to her elbows. She is also having periods of SOB. She also notes that she has tingling on her face. No skin color changes or rashes. Her whole body feels tired as if she has been lifting weights.   Her rheumatologist has ordered an echo due to the SOB. Her rheumatologist also mentioned ideas about obtaining PFTs, nerve conduction tests, CT chest. They discussed new referral to neurologist.   HPI obtained from rheum visit on 3/17:   She started to have symptoms in Dec 2019 with feeling hot and feeling like she was going to faint. She was vomiting. She states it was not stopping - vomiting until she was vomiting bile. She states she had coffee ground emesis in March 2020 so she went to the ED - this only happened twice. She had normal esophagus, normal stomach, normal duodenum, and all biopsies normal. She had MRA of brain that was normal in March 2020.   She was on PDN, which helped her symptoms of hearing loss  initially, but then did not seem to help. She started MTX in Nov 2020 and then finished the PDN taper over 1.5 months (5th time on PDN taper). She states she had 3 really good hearing days, but then she had symptoms come back on. She states this past weekend she had some improvement as well. She has not been on steroids since 2021 started, but she does not feel like MTX helped.   She went to see ENT and PCP, but they could not figure out what was wrong.   She states she stopped MTX 1 week ago - she was only on this for 5 months. She reports being instructed to stop this therapy by her [facility].   She had +ANCA with neg PR3, neg MPO, +ANA 1:320 homogenous and speckled patterns. She had 5 RBCs in urine in Jan 2021, C3 low on 2 occassions, and slightly elevated ALT with normal AST.   She denies any Raynaud's, but does have cold fingers and toes without blue-purple color changes, but does have paresthesias.   She states she was drinking about 2-4 drinks a day, which is unusual for her - no longer drinking EtOH. She denies any current SOB - able to walk 4 miles.   She reports side of the knee pain. She states she has some pain in her right side >> left, which shoots down the back of the leg and she can feel this in her foot.   Currently, she overall feels okay. She reports chest pressure in  her shoulders and chest - not sharp or dull, just pressure. She states this will happen daily, on a regular basis. She has not had a cardiac stress test yet. She has some mild nausea when her ear symptoms come, but no vomiting recently - stopped after May 2020 with PDN. She has overall diminished hearing - baseline almost normal on a good day, but has not fully returned to normal. She also has some dry eyes, confirmed by ophthalmology.   She reports having some itching for about 4-6 months on her scalp/forehead without rash. She will also have itching diffusely, most notable on the upper arms.   She denies  any alopecia, oral/nasal/genital ulcers, photosensitivity, cough, sputum production, hemoptysis, abd pain, b/l LE or UE swelling, dysuria, hematuria.   Her mother has osteoarthritis, maternal GM had stroke at 35 yo and passed away of aneurysm at 36 yo, paternal GM has "thyroid disease" and sister with "thyroid" disease. She denies any other family hx of autoimmune disease.     Past Medical History:  Diagnosis Date  . Anemia   . Anxiety   . Chronic fatigue   . Depression   . IBS (irritable bowel syndrome)   . Primary insomnia    Allergies  Allergen Reactions  . Amoxicillin Rash    Current Outpatient Medications on File Prior to Visit  Medication Sig Dispense Refill  . albuterol (VENTOLIN HFA) 108 (90 Base) MCG/ACT inhaler Inhale 1-2 puffs into the lungs every 6 (six) hours as needed for wheezing or shortness of breath. 8 g 0  . fluticasone (FLONASE) 50 MCG/ACT nasal spray Place 1-2 sprays into both nostrils daily. 15.8 mL 0  . meclizine (ANTIVERT) 25 MG tablet Take 2 tablets (50 mg total) by mouth 3 (three) times daily as needed for dizziness. 30 tablet 0  . mirtazapine (REMERON) 30 MG tablet Take 1 tablet by mouth at bedtime as needed.    . ondansetron (ZOFRAN ODT) 4 MG disintegrating tablet Take 1 tablet (4 mg total) by mouth every 6 (six) hours as needed. 20 tablet 0  . rizatriptan (MAXALT) 10 MG tablet Take 10 mg by mouth as needed for migraine. May repeat in 2 hours if needed     No current facility-administered medications on file prior to visit.    Observations/Objective: NAD. Speaking clearly.  Work of breathing normal.  Alert and oriented. Mood appropriate.   Assessment and Plan: 1. 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 Polyarthralgia Unknown etiology. She is being followed by rheumatology and has had multiple lab tests done.    3. Shortness of breath Patient has a nonspecific presentation of SOB. Echo is pending. Given presentation of SOB along with facial tingling, will  refer to allergy specialist. In the future would consider PFTs and/or imaging of the chests.  - Ambulatory referral to Allergy  4. Neuropathy She has bilateral nerve pain in the upper extremities. Will refer to neurology. Would request nerve conduction studies. Could also consider imaging of the neck in the future to evaluate for a disc abnormality although would have to be large area of compression to affect UE bilaterally.  - Ambulatory referral to Neurology   Follow Up Instructions: In 1-2 months after visits with specialists and further work up completed    I discussed the assessment and treatment plan with the patient. The patient was provided an opportunity to ask questions and all were answered. The patient agreed with the plan and demonstrated an understanding of the instructions.   The patient was  advised to call back or seek an in-person evaluation if the symptoms worsen or if the condition fails to improve as anticipated.     I provided 26 minutes total of non-face-to-face time during this encounter including median intraservice time, reviewing previous notes, investigations, ordering medications, medical decision making, coordinating care and patient verbalized understanding at the end of the visit.    Marcy Siren, D.O. Primary Care at [facility]  08/30/2019, 1:33 PM

## 2019-08-31 ENCOUNTER — Encounter: Payer: Self-pay | Admitting: Neurology

## 2019-08-31 ENCOUNTER — Other Ambulatory Visit: Payer: Self-pay

## 2019-08-31 DIAGNOSIS — R202 Paresthesia of skin: Secondary | ICD-10-CM

## 2019-08-31 LAB — NOVEL CORONAVIRUS, NAA: SARS-CoV-2, NAA: NOT DETECTED

## 2019-08-31 LAB — SARS-COV-2, NAA 2 DAY TAT

## 2019-09-14 ENCOUNTER — Ambulatory Visit (INDEPENDENT_AMBULATORY_CARE_PROVIDER_SITE_OTHER): Payer: Medicaid Other | Admitting: Allergy

## 2019-09-14 ENCOUNTER — Encounter: Payer: Self-pay | Admitting: Allergy

## 2019-09-14 ENCOUNTER — Ambulatory Visit: Payer: Medicaid Other | Admitting: Allergy and Immunology

## 2019-09-14 ENCOUNTER — Other Ambulatory Visit: Payer: Self-pay

## 2019-09-14 VITALS — BP 118/70 | HR 69 | Temp 97.3°F | Resp 18 | Ht 63.0 in | Wt 133.8 lb

## 2019-09-14 DIAGNOSIS — L299 Pruritus, unspecified: Secondary | ICD-10-CM

## 2019-09-14 DIAGNOSIS — J3089 Other allergic rhinitis: Secondary | ICD-10-CM | POA: Diagnosis not present

## 2019-09-14 DIAGNOSIS — R202 Paresthesia of skin: Secondary | ICD-10-CM | POA: Diagnosis not present

## 2019-09-14 DIAGNOSIS — R0602 Shortness of breath: Secondary | ICD-10-CM

## 2019-09-14 MED ORDER — ARNUITY ELLIPTA 100 MCG/ACT IN AEPB: 1.0000 | INHALATION_SPRAY | Freq: Every day | RESPIRATORY_TRACT | 5 refills | Status: AC

## 2019-09-14 NOTE — Progress Notes (Signed)
New Patient Note  RE: Ashlee Ellis MRN: [id number] DOB: [birth year] Date of Office Visit: 09/14/2019  Referring provider: Leary RocaWallace, Catherine Laur* Primary care provider: Arvilla MarketWallace, Catherine Lauren, DO  Chief Complaint: Shortness of Breath (Tightness in chest, tingeling her hands feet)  History of Present Illness: I had the pleasure of seeing Ashlee Ellis for initial evaluation at the [facility] on 09/14/2019. She is a 34 y.o. female, who is referred here by Arvilla MarketWallace, Catherine Lauren, DO for the evaluation of shortness of breath.  In December 2019, patient started to have vomiting and lightheadedness episodes. This progressed until May 2020 which led to hearing loss and tinnitus in her right ear mainly. 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  She was evaluated by ENT and started on prednisone which helped. She had a total of 5 rounds of prednisone and the tinnitus episodes resolve while on the prednisone but then they reoccur. Her vomiting and lightheadedness resolved after the first round of prednisone.  The last few months she also noted shortness of breath.  Sometimes she can go on a walk for 4-5 miles with no issues and sometimes has trouble with putting her kid's shoes on. She also noted some tingling and pruritus mainly on her upper arms and back area. No associated rash.   She saw rheumatology and ANA was positive. She was on methotrexate with no benefit.   Patient had MRI of brain which was unremarkable. She also had normal EGD.  Followed by rheumatology, neurology, cardiology (stress echo), ENT.   Breathing: Patient had negative Covid-19 testing in the past. She saw cardiology and work up was unremarkable.  She reports symptoms of shortness of breath and chest heaviness for 2 months. Patient has history of anxiety but this feels very different from her anxiety. Current medications include  albuterol prn which caused jitteriness. She reports not using aerochamber with inhalers. She tried the following inhalers: none. Main triggers are unknown. In the last month, frequency of symptoms: 3x/week. Frequency of nocturnal symptoms: 0x/month. Frequency of SABA use: 0x/week. Interference with physical activity: sometimes. Sleep is undisturbed. In the last 12 months, emergency room visits/urgent care visits/doctor office visits or hospitalizations due to respiratory issues: once. In the last 12 months, oral steroids courses: 0. Lifetime history of hospitalization for respiratory issues: no. Prior intubations: no. History of pneumonia: no. She was not evaluated by allergist/pulmonologist in the past. Smoking exposure: no but parents smoked in the past. Up to date with flu vaccine: yes. Up to date with pneumonia vaccine: yes.  History of reflux: no.  Itching:  Itching started about 2 months ago. Mainly occurs on her arms, back. Associated symptoms include: none and no associated rash. Suspected triggers are unknown. Denies any fevers, chills, changes in medications, foods, personal care products or recent infections. She has tried the following therapies: benadryl with some benefit.  Previous work up includes: March 2021 - CBC diff, CMP unremarkable.  Previous history of rash/hives/itching: denies. Patient is up to date with the following cancer screening tests: physical exam, pap smears.  No prior COVID-19 diagnosis. Patient did not get vaccine.  Assessment and Plan: Levon Hedgerlita is a 34 y.o. female with: Shortness of breath 34 year old female who presents with a constellation of symptoms and has been worked up by rheumatology, ENT, cardiology, neurology. Main complaint today is shortness of breath with chest tightness, pruritic skin without associated rash and tingling  in extremities. Denies previous history of asthma. Currently has albuterol to use as needed. Negative covid-19 testing and unremarkable  cardiology work up. Patient does have underlying anxiety but states these symptoms are different than her anxiety attacks. Sometimes able to walk 3-4 miles and sometimes has trouble putting shoes on.   Today's spirometry showed: normal pattern with 3% improvement in FEV1 post bronchodilator treatment. Clinically feeling improved.  Given prolonged clinical symptoms will do a 2 month trial of ICS inhaler to see if it helps with the shortness of breath and chest tightness.  Daily controller medication(s): start Arnuity 100 1 puff daily and rinse mouth afterwards. Demonstrated proper use.   May use albuterol rescue inhaler 2 puffs every 4 to 6 hours as needed for shortness of breath, chest tightness, coughing, and wheezing. Monitor frequency of use.   Repeat spirometry at next visit.   Pruritus Upper body pruritus wit no associated rash for 2 months. Tried benadryl with some benefit. Concerned if having any allergies to environmental or food triggers.  Today's skin testing showed: Positive to dust mites and borderline to a few perennial molds. Negative to food panel.   Discussed with patient that I'm not sure how much of the above allergens are causing her pruritus.  Start environmental control measures regarding dust mites.   See below for proper skin care.  Start taking zyrtec (cetirizine) 10mg  daily at night and see if it helps with the itching.  Check with neurology tomorrow if okay to start as she is also having EMG done tomorrow.    May take benadryl 25mg -50mg  every 8 hours as needed additionally for the itching.   If the itching persists then we may need to do some bloodwork at the next visit.   Other allergic rhinitis Mild rhino conjunctivitis symptoms.  Skin testing positive to dust mites and few perennial molds.  Start environmental control measures.  The zyrtec as recommended above should also help with the rhino conjunctivitis symptoms.   Tingling Discussed with  patient that I don't have a good explanation for her tingling, tinnitus and hearing loss symptoms.  I recommend that she continues to follow up with her neurologist and ENT as scheduled.  Return in about 2 months (around 11/14/2019).  Meds ordered this encounter  Medications  . Fluticasone Furoate (ARNUITY ELLIPTA) 100 MCG/ACT AEPB    Sig: Inhale 1 puff into the lungs daily.    Dispense:  30 each    Refill:  5   Other allergy screening: Rhino conjunctivitis:  Mild conjunctivitis symptoms.  Food allergy: no Medication allergy: yes  Amoxicillin - rash Hymenoptera allergy: no Urticaria: no Eczema:no History of recurrent infections suggestive of immunodeficency: no  Diagnostics: Spirometry:  Tracings reviewed. Her effort: Good reproducible efforts. FVC: 3.25L FEV1: 2.58L, 82% predicted FEV1/FVC ratio: 79% Interpretation: Spirometry consistent with normal pattern with 3% improvement in FEV1 post bronchodilator treatment. Clinically feeling improved. Please see scanned spirometry results for details.  Skin Testing: Environmental allergy panel and food allergy panel. Positive test to: dust mites and borderline to few indoor molds.  Results discussed with patient/family. Airborne Adult Perc - 09/14/19 1034    Time Antigen Placed  1034    Allergen Manufacturer     Location  Back    Number of Test  59    Panel 1  Select    1. Control-Buffer 50% Glycerol  Negative    2. Control-Histamine 1 mg/ml  2+    3. Albumin saline  Negative  4. Bahia  Negative    5. French Southern Territories  Negative    6. Johnson  Negative    7. Kentucky Blue  Negative    8. Meadow Fescue  Negative    9. Perennial Rye  Negative    10. Sweet Vernal  Negative    11. Timothy  Negative    12. Cocklebur  Negative    13. Burweed Marshelder  Negative    14. Ragweed, short  Negative    15. Ragweed, Giant  Negative    16. Plantain,  English  Negative    17. Lamb's Quarters  Negative    18. Sheep Sorrell  Negative     19. Rough Pigweed  Negative    20. Marsh Elder, Rough  Negative    21. Mugwort, Common  Negative    22. Ash mix  Negative    23. Birch mix  Negative    24. Beech American  Negative    25. Box, Elder  Negative    26. Cedar, red  Negative    27. Cottonwood, Guinea-Bissau  Negative    28. Elm mix  Negative    29. Hickory mix  Negative    30. Maple mix  Negative    31. Oak, Guinea-Bissau mix  Negative    32. Pecan Pollen  Negative    33. Pine mix  Negative    34. Sycamore Eastern  Negative    35. Walnut, Black Pollen  Negative    36. Alternaria alternata  Negative    37. Cladosporium Herbarum  Negative    38. Aspergillus mix  Negative    39. Penicillium mix  Negative    40. Bipolaris sorokiniana (Helminthosporium)  Negative    41. Drechslera spicifera (Curvularia)  Negative    42. Mucor plumbeus  Negative    43. Fusarium moniliforme  Negative    44. Aureobasidium pullulans (pullulara)  Negative    45. Rhizopus oryzae  Negative    46. Botrytis cinera  Negative    47. Epicoccum nigrum  Negative    48. Phoma betae  Negative    49. Candida Albicans  Negative    50. Trichophyton mentagrophytes  Negative    51. Mite, D Farinae  5,000 AU/ml  3+    52. Mite, D Pteronyssinus  5,000 AU/ml  3+    53. Cat Hair 10,000 BAU/ml  Negative    54.  Dog Epithelia  Negative    55. Mixed Feathers  Negative    56. Horse Epithelia  Negative    57. Cockroach, German  Negative    58. Mouse  Negative    59. Tobacco Leaf  Negative     Intradermal - 09/14/19 1147    Time Antigen Placed  1130    Allergen Manufacturer  Waynette Buttery    Location  Arm    Number of Test  14    Intradermal  Select    Control  Negative    French Southern Territories  Negative    Johnson  Negative    7 Grass  Negative    Ragweed mix  Negative    Weed mix  Negative    Tree mix  Negative    Mold 1  Negative    Mold 2  --   +/-   Mold 3  Negative    Mold 4  Negative    Cat  Negative    Dog  Negative    Cockroach  Negative     Food  Adult Perc -  09/14/19 1000    Time Antigen Placed  1034    Allergen Manufacturer  Waynette Buttery    Location  Back    Number of allergen test  72    Control-Histamine 1 mg/ml  2+    1. Peanut  Negative    2. Soybean  Negative    3. Wheat  Negative    4. Sesame  Negative    5. Milk, cow  Negative    6. Egg White, Chicken  Negative    7. Casein  Negative    8. Shellfish Mix  Negative    9. Fish Mix  Negative    10. Cashew  Negative    11. Pecan Food  Negative    12. Walnut Food  Negative    13. Almond  Negative    14. Hazelnut  Negative    15. [place] nut  Negative    16. Coconut  Negative    17. Pistachio  Negative    18. Catfish  Negative    19. Bass  Negative    20. Trout  Negative    21. Tuna  Negative    22. Salmon  Negative    23. Flounder  Negative    24. Codfish  Negative    25. Shrimp  Negative    26. Crab  Negative    27. Lobster  Negative    28. Oyster  Negative    29. Scallops  Negative    30. Barley  Negative    31. Oat   Negative    32. Rye   Negative    33. Hops  Negative    34. Rice  Negative    35. Cottonseed  Negative    36. Saccharomyces Cerevisiae   Negative    37. Pork  Negative    38. Mal[place] Meat  Negative    39. Chicken Meat  Negative    40. Beef  Negative    41. Lamb  Negative    42. Tomato  Negative    43. White Potato  Negative    44. Sweet Potato  Negative    45. Pea, Green/English  Negative    46. Navy Bean  Negative    47. Mushrooms  Negative    48. Avocado  Negative    49. Onion  Negative    50. Cabbage  Negative    51. Carrots  Negative    52. Celery  Negative    53. Corn  Negative    54. Cucumber  Negative    55. Grape (White seedless)  Negative    56. Orange   Negative    57. Banana  Negative    58. Apple  Negative    59. Peach  Negative    60. Strawberry  Negative    61. Cantaloupe  Negative    62. Watermelon  Negative    63. Pineapple  Negative    64. Chocolate/Cacao bean  Negative    65. Karaya Gum  Negative    66. Acacia (Arabic Gum)   Negative    67. Cinnamon  Negative    68. Nutmeg  Negative    69. Ginger  Negative    70. Garlic  Negative    71. Pepper, black  Negative    72. Mustard  Negative       Past Medical History: Patient Active Problem List   Diagnosis Date Noted  . Pruritus 09/14/2019  . Shortness  of breath 09/14/2019  . Other allergic rhinitis 09/14/2019  . Tingling 09/14/2019   Past Medical History:  Diagnosis Date  . Anemia   . Anxiety   . Chronic fatigue   . Depression   . IBS (irritable bowel syndrome)   . Primary insomnia   . Recurrent upper respiratory infection (URI)    Past Surgical History: Past Surgical History:  Procedure Laterality Date  . CESAREAN SECTION    . TUBAL LIGATION     Medication List:  Current Outpatient Medications  Medication Sig Dispense Refill  . Fluticasone Furoate (ARNUITY ELLIPTA) 100 MCG/ACT AEPB Inhale 1 puff into the lungs daily. 30 each 5   No current facility-administered medications for this visit.   Allergies: Allergies  Allergen Reactions  . Amoxicillin Rash   Social History: Social History   Socioeconomic History  . Marital status: Divorced    Spouse name: Not on file  . Number of children: Not on file  . Years of education: Not on file  . Highest education level: Not on file  Occupational History  . Not on file  Tobacco Use  . Smoking status: Never Smoker  . Smokeless tobacco: Never Used  Substance and Sexual Activity  . Alcohol use: Yes    Alcohol/week: 4.0 - 5.0 standard drinks    Types: 4 - 5 Glasses of wine per week  . Drug use: Not on file  . Sexual activity: Yes    Partners: Male    Birth control/protection: Surgical  Other Topics Concern  . Not on file  Social History Narrative  . Not on file   Social Determinants of Health   Financial Resource Strain:   . Difficulty of Paying Living Expenses:   Food Insecurity:   . Worried About Programme researcher, broadcasting/film/video in the Last Year:   . Barista in the Last Year:     Transportation Needs:   . Freight forwarder (Medical):   Marland Kitchen Lack of Transportation (Non-Medical):   Physical Activity:   . Days of Exercise per Week:   . Minutes of Exercise per Session:   Stress:   . Feeling of Stress :   Social Connections:   . Frequency of Communication with Friends and Family:   . Frequency of Social Gatherings with Friends and Family:   . Attends Religious Services:   . Active Member of Clubs or Organizations:   . Attends Banker Meetings:   Marland Kitchen Marital Status:    Lives in a 35 year old home. Smoking: denies Occupation: Financial controller HistorySurveyor, minerals in the house: no Carpet in the family room: no Carpet in the bedroom: no Heating: gas and electric Cooling: central Pet: yes 1 cat x 1 yr, 1 dog outdoors x 8 yrs  Family History: Family History  Problem Relation Age of Onset  . Depression Mother   . Arthritis Mother   . Allergic rhinitis Mother   . Alcohol abuse Father   . Hashimoto's thyroiditis Sister   . Alcohol abuse Maternal Grandmother   . Aneurysm Maternal Grandmother   . Arthritis Maternal Grandmother   . Stroke Maternal Grandmother   . Hypertension Maternal Grandfather   . Heart disease Maternal Grandfather   . Diabetes Paternal Grandmother   . Thyroid disease Paternal Grandmother   . Breast cancer Maternal Aunt   . Learning disabilities Son   . Asthma Neg Hx   . Eczema Neg Hx   . Urticaria Neg Hx  Review of Systems  Constitutional: Negative for appetite change, chills, fever and unexpected weight change.  HENT: Positive for hearing loss, sneezing and tinnitus. Negative for congestion and rhinorrhea.   Eyes: Negative for itching.  Respiratory: Positive for shortness of breath. Negative for cough, chest tightness and wheezing.   Cardiovascular: Negative for chest pain.  Gastrointestinal: Negative for abdominal pain.  Genitourinary: Negative for difficulty urinating.  Skin: Negative for rash.   Allergic/Immunologic: Positive for environmental allergies. Negative for food allergies.  Neurological: Positive for headaches. Negative for dizziness and light-headedness.   Objective: BP 118/70 (BP Location: Right Arm, Patient Position: Sitting, Cuff Size: Normal)   Pulse 69   Temp (!) 97.3 F (36.3 C) (Temporal)   Resp 18   Ht 5\' 3"  (1.6 m)   Wt 133 lb 12 oz (60.7 kg)   SpO2 100%   BMI 23.69 kg/m  Body mass index is 23.69 kg/m. Physical Exam  Constitutional: She is oriented to person, place, and time. She appears well-developed and well-nourished.  HENT:  Head: Normocephalic and atraumatic.  Right Ear: External ear normal.  Left Ear: External ear normal.  Nose: Nose normal.  Mouth/Throat: Oropharynx is clear and moist.  Eyes: Conjunctivae and EOM are normal.  Cardiovascular: Normal rate, regular rhythm and normal heart sounds. Exam reveals no gallop and no friction rub.  No murmur heard. Pulmonary/Chest: Effort normal and breath sounds normal. She has no wheezes. She has no rales.  Abdominal: Soft.  Musculoskeletal:     Cervical back: Neck supple.  Neurological: She is alert and oriented to person, place, and time.  Skin: Skin is warm. No rash noted.  Psychiatric: She has a normal mood and affect. Her behavior is normal.  Nursing note and vitals reviewed.  The plan was reviewed with the patient/family, and all questions/concerned were addressed.  It was my pleasure to see Weda today and participate in her care. Please feel free to contact me with any questions or concerns.  Sincerely,  Rexene Alberts, DO Allergy & Immunology  [facility] office: [phone] [facility] office: [place] office: [phone]

## 2019-09-14 NOTE — Assessment & Plan Note (Signed)
Upper body pruritus wit no associated rash for 2 months. Tried benadryl with some benefit. Concerned if having any allergies to environmental or food triggers.  Today's skin testing showed: Positive to dust mites and borderline to a few perennial molds. Negative to food panel.   Discussed with patient that I'm not sure how much of the above allergens are causing her pruritus.  Start environmental control measures regarding dust mites.   See below for proper skin care.  Start taking zyrtec (cetirizine) 10mg  daily at night and see if it helps with the itching.  Check with neurology tomorrow if okay to start as she is also having EMG done tomorrow.    May take benadryl 25mg -50mg  every 8 hours as needed additionally for the itching.   If the itching persists then we may need to do some bloodwork at the next visit.

## 2019-09-14 NOTE — Patient Instructions (Addendum)
Today's breathing test showed:  Normal and minimal improvement after the breathing treatment. Daily controller medication(s): start Arnuity 100 1 puff daily and rinse mouth afterwards.   May use albuterol rescue inhaler 2 puffs every 4 to 6 hours as needed for shortness of breath, chest tightness, coughing, and wheezing. Monitor frequency of use.   Today's skin testing showed:  Positive to dust mites and borderline to a few perennial molds.   Start environmental control measures as below.   Itching:  See below for proper skin care.  Start taking zyrtec (cetirizine) 10mg  daily at night and see if it helps with the itching.  See what your neurologist says about starting the above medication.   May take benadryl 25mg -50mg  every 8 hours as needed additionally for the itching.   If the itching persists then we may need to do some bloodwork at the next visit.    Not sure what to make of your tinnitus and hearing loss. I recommend that you follow up with your neurologist and ENT as scheduled.  Follow up in 2 months or sooner if needed.   Skin care recommendations  Bath time: . Always use lukewarm water. AVOID very hot or cold water. Keep bathing time to 5-10 minutes. . Do NOT use bubble bath. . Use a mild soap and use just enough to wash the dirty areas. . Do NOT scrub skin vigorously.  . After bathing, pat dry your skin with a towel. Do NOT rub or scrub the skin.  Moisturizers and prescriptions:  . ALWAYS apply moisturizers immediately after bathing (within 3 minutes). This helps to lock-in moisture. . Use the moisturizer several times a day over the whole body. summer moisturizers include: Aveeno, CeraVe, Cetaphil. winter moisturizers include: Aquaphor, Vaseline, Cerave, Cetaphil, Eucerin, Vanicream. . When using moisturizers along with medications, the moisturizer should be applied about one hour after applying the medication to prevent diluting effect of the  medication or moisturize around where you applied the medications. When not using medications, the moisturizer can be continued twice daily as maintenance.  Laundry and clothing: . Avoid laundry products with added color or perfumes. . Use unscented hypo-allergenic laundry products such as Tide free, Cheer free & gentle, and All free and clear.  . If the skin still seems dry or sensitive, you can try double-rinsing the clothes. . Avoid tight or scratchy clothing such as wool. . Do not use fabric softeners or dyer sheets.  Control of House Dust Mite Allergen . Dust mite allergens are a common trigger of allergy and asthma symptoms. While they can be found throughout the house, these microscopic creatures thrive in warm, humid environments such as bedding, upholstered furniture and carpeting. . Because so much time is spent in the bedroom, it is essential to reduce mite levels there.  . Encase pillows, mattresses, and box springs in special allergen-proof fabric covers or airtight, zippered plastic covers.  . Bedding should be washed weekly in hot water (130 F) and dried in a hot dryer. Allergen-proof covers are available for comforters and pillows that can't be regularly washed.  Marland Kitchen the allergy-proof covers every few months. Minimize clutter in the bedroom. Keep pets out of the bedroom.  Peri Jefferson Keep humidity less than 50% by using a dehumidifier or air conditioning. You can buy a humidity measuring device called a hygrometer to monitor this.  . If possible, replace carpets with hardwood, linoleum, or washable area rugs. If that's not possible, vacuum frequently  with a vacuum that has a HEPA filter. . Remove all upholstered furniture and non-washable window drapes from the bedroom. . Remove all non-washable stuffed toys from the bedroom.  Wash stuffed toys weekly.  Mold Control . Mold and fungi can grow on a variety of surfaces provided certain temperature and moisture conditions exist.   . Outdoor molds grow on plants, decaying vegetation and soil. The major outdoor mold, Alternaria and Cladosporium, are found in very high numbers during hot and dry conditions. Generally, a late summer - fall peak is seen for common outdoor fungal spores. Rain will temporarily lower outdoor mold spore count, but counts rise rapidly when the rainy period ends. . The most important indoor molds are Aspergillus and Penicillium. Dark, humid and poorly ventilated basements are ideal sites for mold growth. The next most common sites of mold growth are the bathroom and the kitchen. Outdoor (Seasonal) Mold Control . Use air conditioning and keep windows closed. . Avoid exposure to decaying vegetation. Marland Kitchen Avoid leaf raking. . Avoid grain handling. . Consider wearing a face mask if working in moldy areas.  Indoor (Perennial) Mold Control  . Maintain humidity below 50%. . Get rid of mold growth on hard surfaces with water, detergent and, if necessary, 5% bleach (do not mix with other cleaners). Then dry the area completely. If mold covers an area more than 10 square feet, consider hiring an indoor environmental professional. . For clothing, washing with soap and water is best. If moldy items cannot be cleaned and dried, throw them away. . Remove sources e.g. contaminated carpets. . Repair and seal leaking roofs or pipes. Using dehumidifiers in damp basements may be helpful, but empty the water and clean units regularly to prevent mildew from forming. All rooms, especially basements, bathrooms and kitchens, require ventilation and cleaning to deter mold and mildew growth. Avoid carpeting on concrete or damp floors, and storing items in damp areas.

## 2019-09-14 NOTE — Assessment & Plan Note (Signed)
Discussed with patient that I don't have a good explanation for her tingling, tinnitus and hearing loss symptoms.  I recommend that she continues to follow up with her neurologist and ENT as scheduled.

## 2019-09-14 NOTE — Assessment & Plan Note (Signed)
Mild rhino conjunctivitis symptoms.  Skin testing positive to dust mites and few perennial molds.  Start environmental control measures.  The zyrtec as recommended above should also help with the rhino conjunctivitis symptoms.

## 2019-09-14 NOTE — Assessment & Plan Note (Addendum)
34 year old female who presents with a constellation of symptoms and has been worked up by rheumatology, ENT, cardiology, neurology. Main complaint today is shortness of breath with chest tightness, pruritic skin without associated rash and tingling in extremities. Denies previous history of asthma. Currently has albuterol to use as needed. Negative covid-19 testing and unremarkable cardiology work up. Patient does have underlying anxiety but states these symptoms are different than her anxiety attacks. Sometimes able to walk 3-4 miles and sometimes has trouble putting shoes on.   Today's spirometry showed: normal pattern with 3% improvement in FEV1 post bronchodilator treatment. Clinically feeling improved.  Given prolonged clinical symptoms will do a 2 month trial of ICS inhaler to see if it helps with the shortness of breath and chest tightness.  Daily controller medication(s): start Arnuity 100 1 puff daily and rinse mouth afterwards. Demonstrated proper use.   May use albuterol rescue inhaler 2 puffs every 4 to 6 hours as needed for shortness of breath, chest tightness, coughing, and wheezing. Monitor frequency of use.   Repeat spirometry at next visit.

## 2019-09-15 ENCOUNTER — Ambulatory Visit: Payer: Medicaid Other | Admitting: Neurology

## 2019-09-15 DIAGNOSIS — R202 Paresthesia of skin: Secondary | ICD-10-CM | POA: Diagnosis not present

## 2019-09-15 NOTE — Procedures (Signed)
[facility]  [address]  [place], [place] [address] Tel: [phone] Fax:  [phone] Test Date:  09/15/2019  Patient: Ashlee Ellis DOB: 1985/08/01 Physician: Narda Amber, DO  Sex: Female Height: 5\' 2"  Ref Phys: Phill Myron, MD  ID#: [id number] Temp: 32.0C Technician:    Patient Complaints: This is a 34 year old female referred for evaluation of generalized paresthesias.  NCV & EMG Findings: Extensive electrodiagnostic testing of the right upper and lower extremity shows:  1. All sensory responses including the right median, ulnar, mixed palmar, sural, and superficial peroneal nerves are within normal limits. 2. All motor responses including the right median, ulnar, peroneal, and tibial nerves are within normal limits. 3. Right tibial H reflex study is within normal limits. 4. There is no evidence of active or chronic motor axonal loss changes affecting any of the tested muscles.  Motor unit configuration and recruitment pattern is within normal limits.  Impression: This is a normal study of the right upper and lower extremities.  In particular, there is no evidence of a sensorimotor polyneuropathy or cervical/lumbosacral radiculopathy.   ___________________________ Narda Amber, DO    Nerve Conduction Studies Anti Sensory Summary Table   Stim Site NR Peak (ms) Norm Peak (ms) P-T Amp (V) Norm P-T Amp  Right Median Anti Sensory (2nd Digit)  32C  Wrist    2.8 <3.4 53.3 >20  Right Sup Peroneal Anti Sensory (Ant Lat Mall)  32C  12 cm    2.4 <4.5 11.8 >5  Right Sural Anti Sensory (Lat Mall)  32C  Calf    2.0 <4.5 28.8 >5  Right Ulnar Anti Sensory (5th Digit)  32C  Wrist    2.7 <3.1 45.2 >12   Motor Summary Table   Stim Site NR Onset (ms) Norm Onset (ms) O-P Amp (mV) Norm O-P Amp Site1 Site2 Delta-0 (ms) Dist (cm) Vel (m/s) Norm Vel (m/s)  Right Median Motor (Abd Poll Brev)  32C  Wrist    2.6 <3.9 14.0 >6 Elbow Wrist 4.2 28.0 67 >50    Elbow    6.8  13.4         Right Peroneal Motor (Ext Dig Brev)  32C  Ankle    4.1 <5.5 4.0 >3 B Fib Ankle 7.8 38.0 49 >40  B Fib    11.9  3.5  Poplt B Fib 1.9 8.0 42 >40  Poplt    13.8  3.3         Right Tibial Motor (Abd Hall Brev)  32C  Ankle    3.2 <6.0 12.3 >8 Knee Ankle 8.1 41.0 51 >40  Knee    11.3  9.5         Right Ulnar Motor (Abd Dig Minimi)  32C  Wrist    2.1 <3.1 11.7 >7 B Elbow Wrist 3.5 22.0 63 >50  B Elbow    5.6  11.3  A Elbow B Elbow 1.7 10.0 59 >50  A Elbow    7.3  11.3          Comparison Summary Table   Stim Site NR Peak (ms) Norm Peak (ms) P-T Amp (V) Site1 Site2 Delta-P (ms) Norm Delta (ms)  Right Median/Ulnar Palm Comparison (Wrist - 8cm)  32C  Median Palm    1.6 <2.2 84.1 Median Palm Ulnar Palm 0.0   Ulnar Palm    1.6 <2.2 26.2       H Reflex Studies   NR H-Lat (ms) Lat Norm (ms)  L-R H-Lat (ms)  Right Tibial (Gastroc)  32C     31.16 <35    EMG   Side Muscle Ins Act Fibs Psw Fasc Number Recrt Dur Dur. Amp Amp. Poly Poly. Comment  Right AntTibialis Nml Nml Nml Nml Nml Nml Nml Nml Nml Nml Nml Nml N/A  Right Gastroc Nml Nml Nml Nml Nml Nml Nml Nml Nml Nml Nml Nml N/A  Right Flex Dig Long Nml Nml Nml Nml Nml Nml Nml Nml Nml Nml Nml Nml N/A  Right RectFemoris Nml Nml Nml Nml Nml Nml Nml Nml Nml Nml Nml Nml N/A  Right GluteusMed Nml Nml Nml Nml Nml Nml Nml Nml Nml Nml Nml Nml N/A  Right 1stDorInt Nml Nml Nml Nml Nml Nml Nml Nml Nml Nml Nml Nml N/A  Right PronatorTeres Nml Nml Nml Nml Nml Nml Nml Nml Nml Nml Nml Nml N/A  Right Biceps Nml Nml Nml Nml Nml Nml Nml Nml Nml Nml Nml Nml N/A  Right Triceps Nml Nml Nml Nml Nml Nml Nml Nml Nml Nml Nml Nml N/A  Right Deltoid Nml Nml Nml Nml Nml Nml Nml Nml Nml Nml Nml Nml N/A      Waveforms:

## 2019-09-21 ENCOUNTER — Telehealth: Payer: Self-pay

## 2019-09-21 NOTE — Telephone Encounter (Signed)
Received fax that Arnuity Ellipta 100 mcg is not covered by insurance. It states the only inhaler covered and a PA not required is Flovent . Please advise.

## 2019-09-22 MED ORDER — FLOVENT HFA 110 MCG/ACT IN AERO: 2.0000 | INHALATION_SPRAY | Freq: Two times a day (BID) | RESPIRATORY_TRACT | 5 refills | Status: AC

## 2019-09-22 NOTE — Telephone Encounter (Signed)
Called patient and advised change in medication. Patient verbalized understanding.

## 2019-09-22 NOTE — Telephone Encounter (Signed)
Sent in Flovent 110 2 puffs twice a day. Please call patient and make aware of med change.  Thank you.

## 2019-09-22 NOTE — Addendum Note (Signed)
Addended by: Ellamae Sia on: 09/22/2019 08:17 AM   Modules accepted: Orders

## 2019-10-06 ENCOUNTER — Telehealth: Payer: Self-pay | Admitting: Internal Medicine

## 2019-10-06 NOTE — Telephone Encounter (Signed)
Patient needs new referral for ENT

## 2019-10-07 ENCOUNTER — Other Ambulatory Visit: Payer: Self-pay | Admitting: Internal Medicine

## 2019-10-07 DIAGNOSIS — H9193 Unspecified hearing loss, bilateral: Secondary | ICD-10-CM

## 2019-10-07 DIAGNOSIS — H9313 Tinnitus, bilateral: Secondary | ICD-10-CM

## 2019-10-07 NOTE — Telephone Encounter (Signed)
I put in new ENT referral.   Marcy Siren, D.O. Primary [facility] at [facility]  10/07/2019, 4:23 PM

## 2019-11-16 ENCOUNTER — Ambulatory Visit: Payer: Medicaid Other | Admitting: Allergy

## 2019-12-09 IMAGING — MR MRA HEAD WITHOUT CONTRAST
1 series · 20 of 48 positions shown · non-contrast
Comparison: MR brain 07/21/2018

CLINICAL DATA: New daily persistent headache. Pulsatile tinnitus of
RIGHT ear.

EXAM:
MRA HEAD WITHOUT CONTRAST
TECHNIQUE: Angiographic images of the Circle of Willis were obtained using MRA
technique without intravenous contrast.

[Series 5: TOF · axial · 0.5mm · 0.41mm/px · z∈[-37,+59]mm · 20 of 205 slices shown]
[im 1/205]
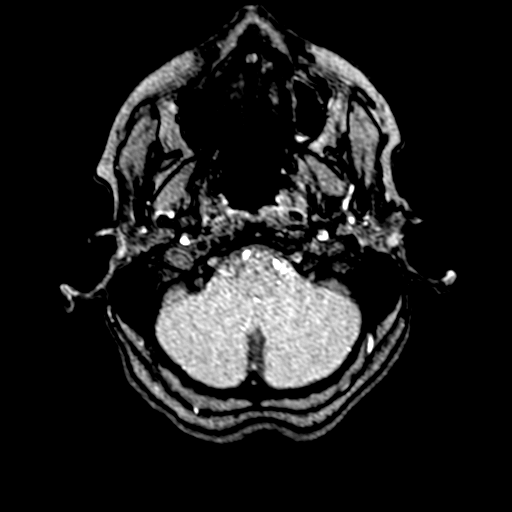
[im 5/205]
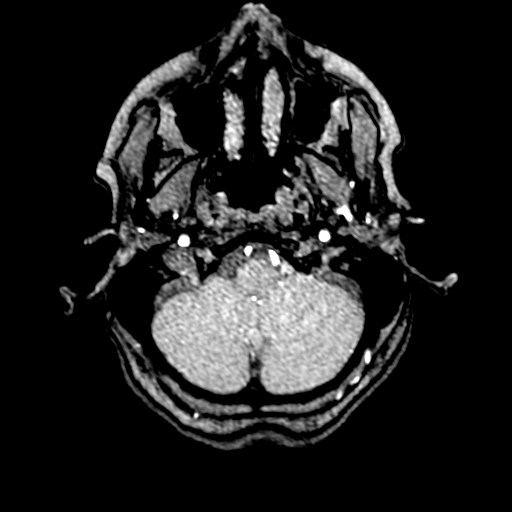
[im 9/205]
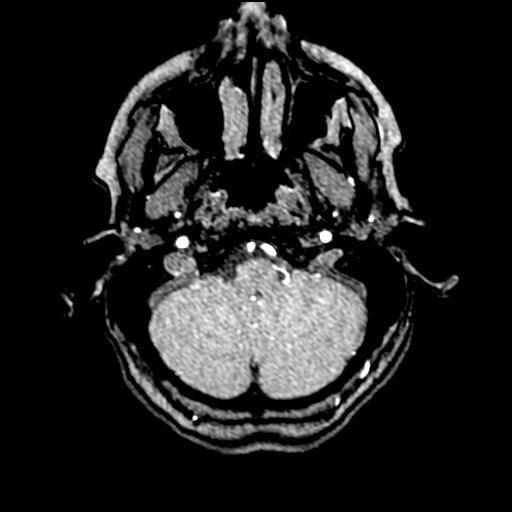
[im 14/205]
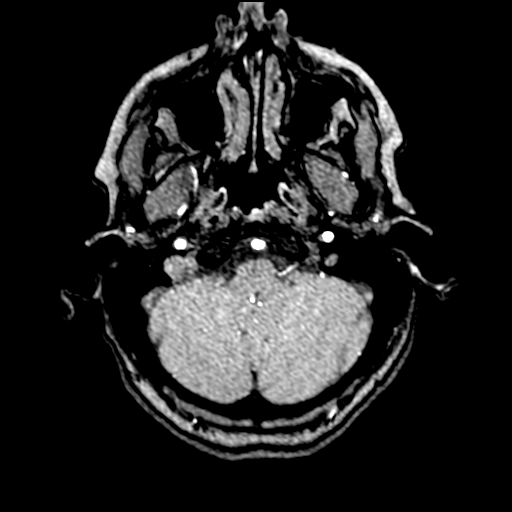
[im 18/205]
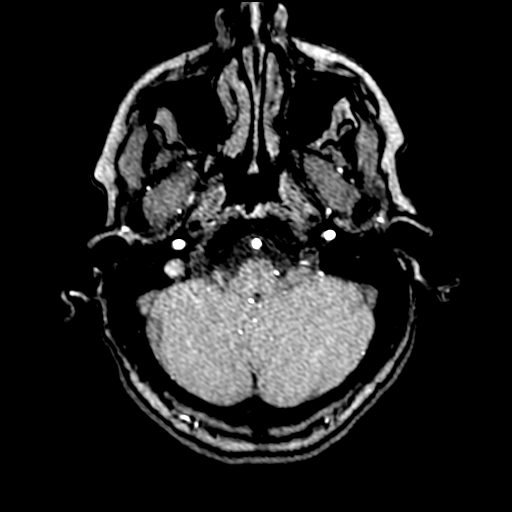
[im 22/205]
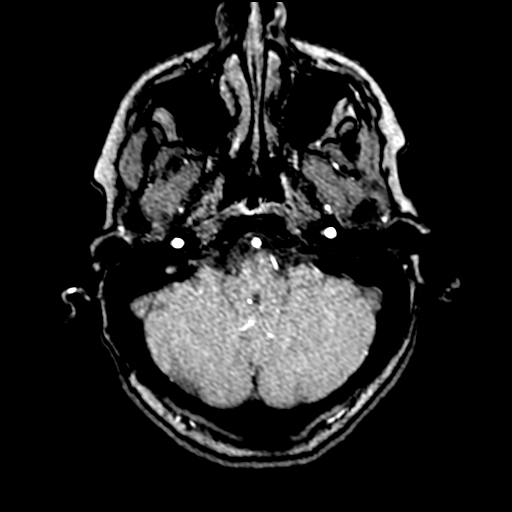
[im 27/205]
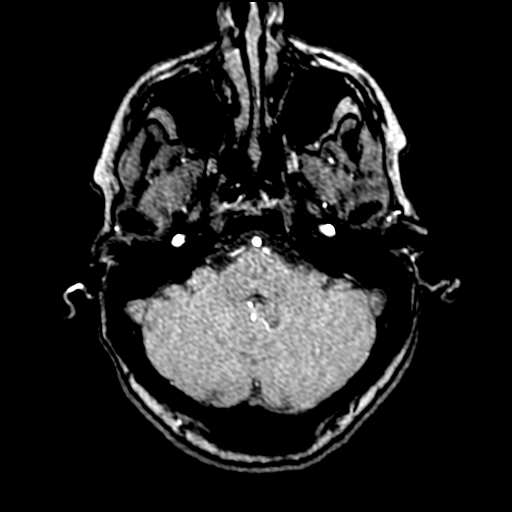
[im 31/205]
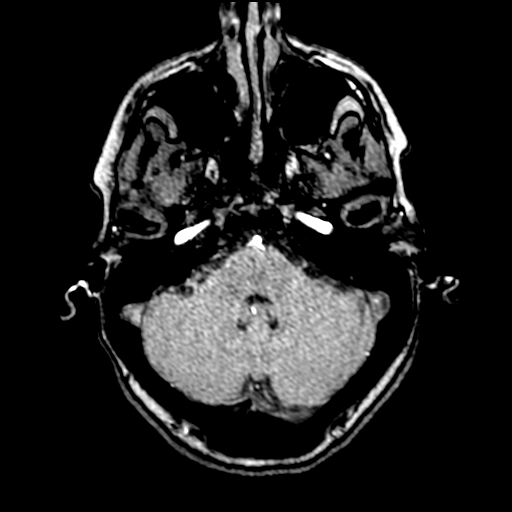
[im 35/205]
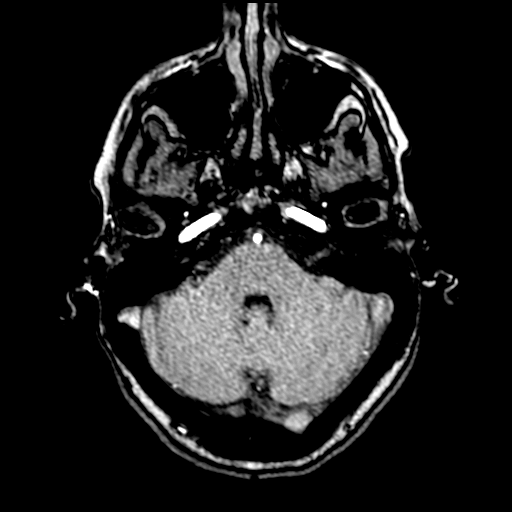
[im 40/205]
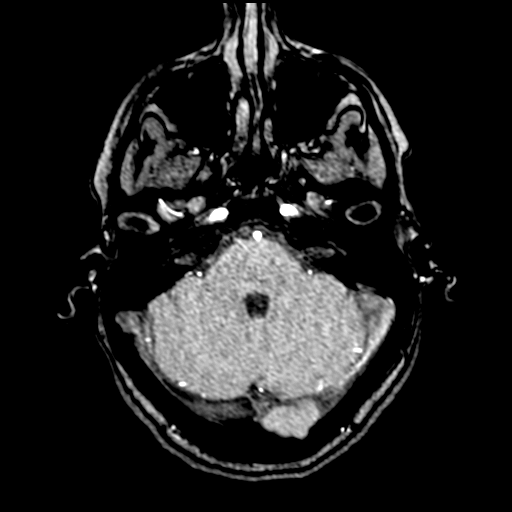
[im 44/205]
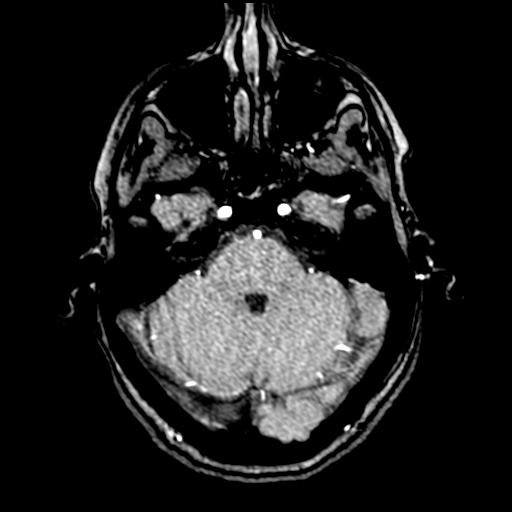
[im 48/205]
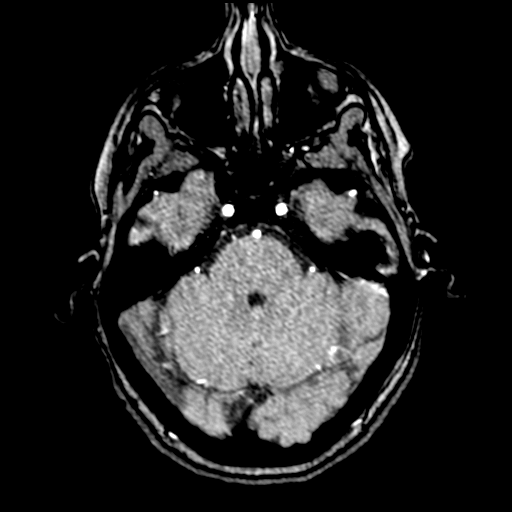
[im 66/205]
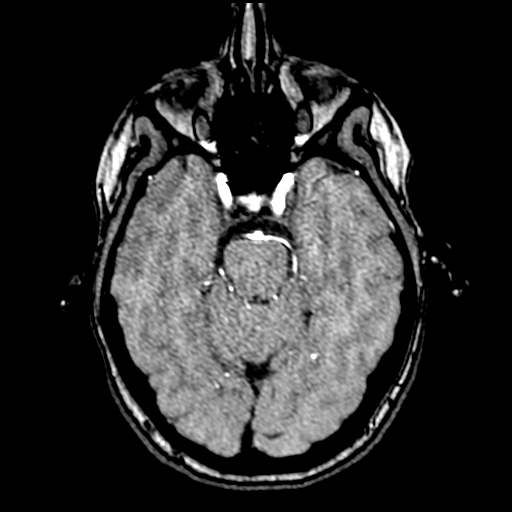
[im 92/205]
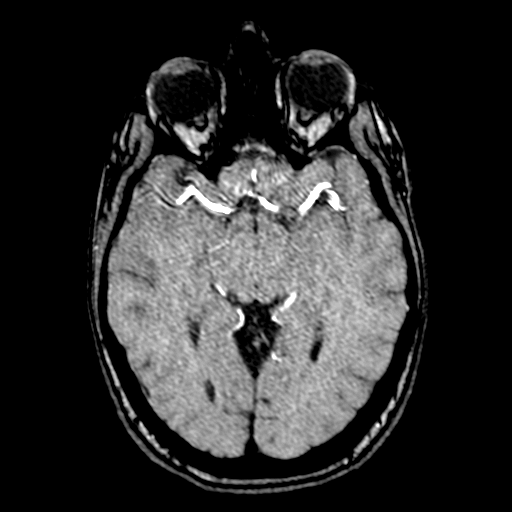
[im 105/205]
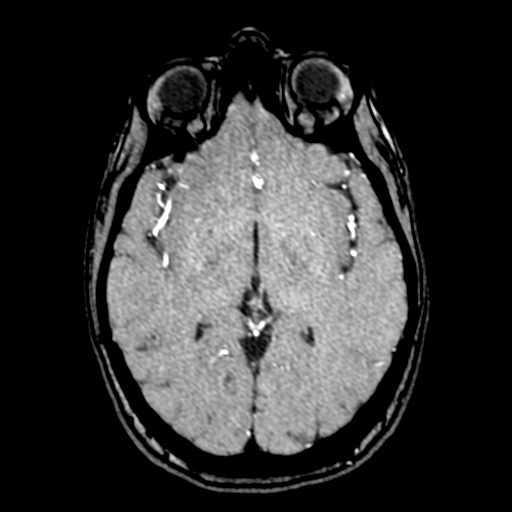
[im 118/205]
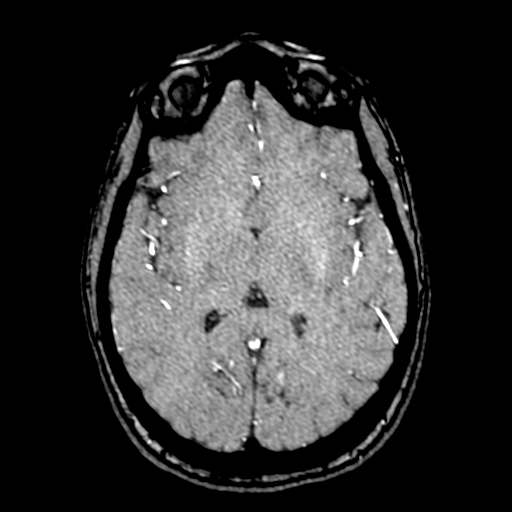
[im 144/205]
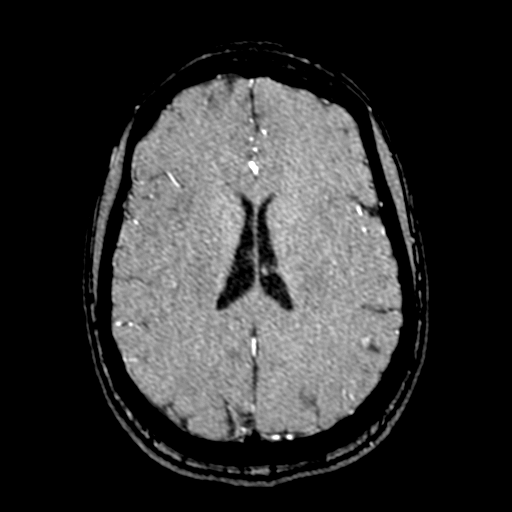
[im 170/205]
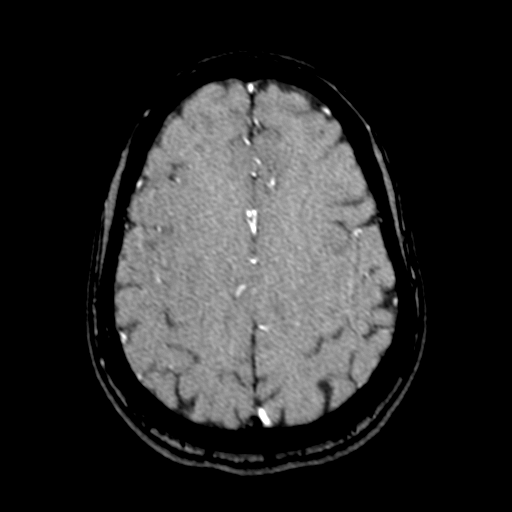
[im 174/205]
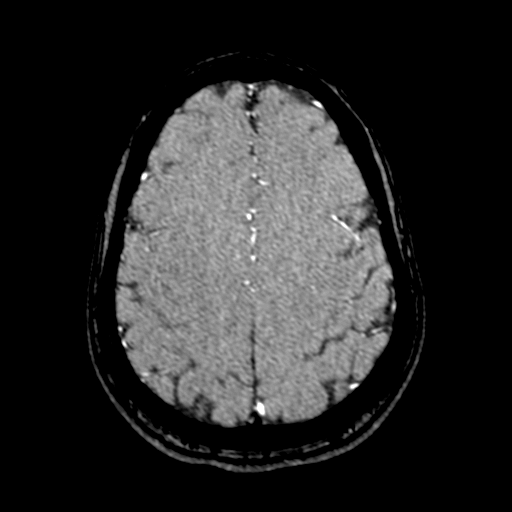
[im 196/205]
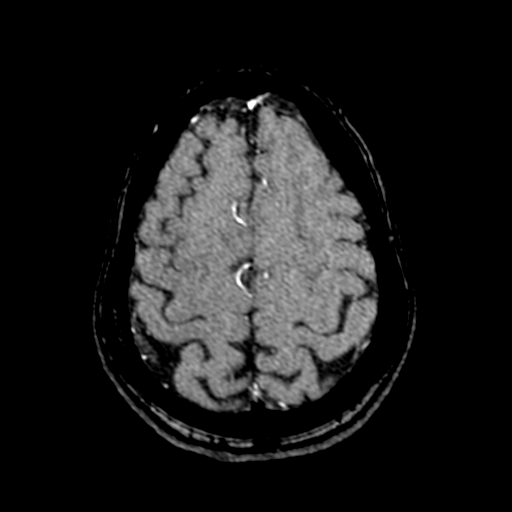

[20 of 48 positions shown; findings below may reference images not displayed]

FINDINGS: The internal carotid arteries are widely patent. The basilar artery
is widely patent with vertebrals codominant. There is no proximal
stenosis of the anterior, middle, or posterior cerebral arteries. No
cerebellar branch occlusion.

No evidence of saccular aneurysm. No evidence of vascular loop. No
evidence of vascular malformation.

1 cm sized area of susceptibility artifact LEFT frontal convexity,
consistent with an osteoma; this does not enhance on post infusion
MR imaging.
IMPRESSION: Negative MRA intracranial circulation. No flow-limiting stenosis. No
vascular malformation or vascular loop. No saccular aneurysm.

## 2020-02-16 ENCOUNTER — Other Ambulatory Visit: Payer: Self-pay

## 2020-02-16 ENCOUNTER — Other Ambulatory Visit: Payer: Medicaid Other

## 2020-02-16 DIAGNOSIS — Z20822 Contact with and (suspected) exposure to covid-19: Secondary | ICD-10-CM

## 2020-02-16 NOTE — Addendum Note (Signed)
Addended by: Leanna Sato on: 02/16/2020 10:18 AM   Modules accepted: Orders

## 2020-02-17 LAB — NOVEL CORONAVIRUS, NAA: SARS-CoV-2, NAA: NOT DETECTED

## 2020-02-17 LAB — SARS-COV-2, NAA 2 DAY TAT

## 2022-08-08 ENCOUNTER — Telehealth: Payer: Self-pay | Admitting: Gastroenterology

## 2022-08-08 NOTE — Telephone Encounter (Signed)
Good afternoon Dr. Candis Schatz,   Supervising MD for today 3/14 PM  Patient called stating that she had a referral from Dr. 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 Patient has previous gastro history with [facility] and is requesting transfer of care because she does not want to continue to travel to [place]. Patients records are in epic, will you please review and advise on scheduling patient?  Thank you.

## 2023-03-21 ENCOUNTER — Other Ambulatory Visit (HOSPITAL_COMMUNITY): Payer: Self-pay | Admitting: Pediatrics

## 2023-03-21 DIAGNOSIS — M5412 Radiculopathy, cervical region: Secondary | ICD-10-CM

## 2023-03-21 DIAGNOSIS — D420 Neoplasm of uncertain behavior of cerebral meninges: Secondary | ICD-10-CM

## 2023-03-21 DIAGNOSIS — G4452 New daily persistent headache (NDPH): Secondary | ICD-10-CM

## 2023-03-25 ENCOUNTER — Ambulatory Visit (HOSPITAL_COMMUNITY)
Admission: RE | Admit: 2023-03-25 | Discharge: 2023-03-25 | Discharge status: Home / Self Care | Payer: BC Managed Care – PPO | Source: Ambulatory Visit | Attending: Pediatrics

## 2023-03-25 ENCOUNTER — Ambulatory Visit (HOSPITAL_COMMUNITY)
Admission: RE | Admit: 2023-03-25 | Discharge: 2023-03-25 | Disposition: A | Discharge status: Home / Self Care | Payer: BC Managed Care – PPO | Source: Ambulatory Visit | Attending: Pediatrics | Admitting: Pediatrics

## 2023-03-25 DIAGNOSIS — M5412 Radiculopathy, cervical region: Secondary | ICD-10-CM

## 2023-03-25 DIAGNOSIS — D420 Neoplasm of uncertain behavior of cerebral meninges: Secondary | ICD-10-CM | POA: Diagnosis present

## 2023-03-25 DIAGNOSIS — G4452 New daily persistent headache (NDPH): Secondary | ICD-10-CM | POA: Diagnosis present

## 2023-03-25 MED ORDER — GADOBUTROL 1 MMOL/ML IV SOLN
6.0000 mL | Freq: Once | INTRAVENOUS | Status: AC | PRN
  Administered 2023-03-25: 6 mL via INTRAVENOUS

## 2023-12-23 ENCOUNTER — Other Ambulatory Visit: Payer: Self-pay | Admitting: Medical Genetics

## 2024-03-06 ENCOUNTER — Other Ambulatory Visit: Payer: Self-pay | Admitting: Medical Genetics

## 2024-03-06 DIAGNOSIS — Z006 Encounter for examination for normal comparison and control in clinical research program: Secondary | ICD-10-CM

## 2024-05-21 LAB — GENECONNECT MOLECULAR SCREEN: Genetic Analysis Overall Interpretation: NEGATIVE
